# Patient Record
Sex: Female | Born: 1965
Health system: Southern US, Community
[De-identification: ages and names within clinical notes are randomized; demographics above are authoritative.]

## PROBLEM LIST (undated history)

## (undated) DIAGNOSIS — E785 Hyperlipidemia, unspecified: Secondary | ICD-10-CM

## (undated) DIAGNOSIS — E119 Type 2 diabetes mellitus without complications: Secondary | ICD-10-CM

## (undated) DIAGNOSIS — I10 Essential (primary) hypertension: Secondary | ICD-10-CM

## (undated) DIAGNOSIS — G709 Myoneural disorder, unspecified: Secondary | ICD-10-CM

## (undated) DIAGNOSIS — G629 Polyneuropathy, unspecified: Secondary | ICD-10-CM

## (undated) HISTORY — DX: Myoneural disorder, unspecified: G70.9

## (undated) HISTORY — DX: Hyperlipidemia, unspecified: E78.5

## (undated) HISTORY — DX: Essential (primary) hypertension: I10

---

## 2001-06-28 HISTORY — PX: TUBAL LIGATION: SHX77

## 2014-02-03 ENCOUNTER — Encounter (HOSPITAL_COMMUNITY): Payer: Self-pay | Admitting: *Deleted

## 2014-02-03 ENCOUNTER — Inpatient Hospital Stay (HOSPITAL_COMMUNITY)
Admission: AD | Admit: 2014-02-03 | Discharge: 2014-02-03 | Disposition: A | Discharge status: Home / Self Care | Payer: No Typology Code available for payment source | Source: Ambulatory Visit | Attending: Obstetrics & Gynecology | Admitting: Obstetrics & Gynecology

## 2014-02-03 DIAGNOSIS — N907 Vulvar cyst: Secondary | ICD-10-CM

## 2014-02-03 DIAGNOSIS — N9089 Other specified noninflammatory disorders of vulva and perineum: Secondary | ICD-10-CM | POA: Insufficient documentation

## 2014-02-03 HISTORY — DX: Type 2 diabetes mellitus without complications: E11.9

## 2014-02-03 NOTE — MAU Provider Note (Signed)
Chief Complaint: Cyst   First Provider Initiated Contact with Patient 02/03/14 2030     SUBJECTIVE HPI: Tammy Chan is a 48 y.o. G3P3 female who presents with a tender lump in her left labia majora x 2 days. Tried squeezing it to see if it would drain, but nothing came out. No Hx of similar masses. [place] in [place]. (moved here last October.)  Denies fever, chills, drainage, bleeding or redness. Has not tried anything for the pain.    Past Medical History  Diagnosis Date  . Diabetes mellitus without complication    OB History  Gravida Para Term Preterm AB SAB TAB Ectopic Multiple Living  3 3        3     # Outcome Date GA Lbr Len/2nd Weight Sex Delivery Anes PTL Lv  3 PAR           2 PAR           1 PAR              Past Surgical History  Procedure Laterality Date  . Tubal ligation     History   Social History  . Marital Status: Single    Spouse Name: N/A    Number of Children: N/A  . Years of Education: N/A   Occupational History  . Not on file.   Social History Main Topics  . Smoking status: Never Smoker   . Smokeless tobacco: Never Used  . Alcohol Use: Yes     Comment: socical  . Drug Use: No  . Sexual Activity: Not on file   Other Topics Concern  . Not on file   Social History Narrative  . No narrative on file   No current facility-administered medications on file prior to encounter.   No current outpatient prescriptions on file prior to encounter.   Allergies  Allergen Reactions  . Erythromycin Nausea And Vomiting  . Lisinopril Itching    Pt reports itching in throat    ROS: Pertinent items in HPI.  OBJECTIVE Blood pressure 139/81, pulse 85, temperature 98.1 F (36.7 C), temperature source Oral, resp. rate 18, height 5\' 2"  (1.575 m), weight 80.287 kg (177 lb). GENERAL: Well-developed, well-nourished female in no acute distress.  HEENT: Normocephalic HEART: normal rate RESP: normal effort ABDOMEN: Soft,  non-tender EXTREMITIES: Nontender, no edema NEURO: Alert and oriented PELVIC EXAM: NEFG except for 3 cm non-fluctuant, tender mess w/ mild erythema in mid-upper left labia majora. No warmth, drainage or bleeding. physiologic discharge, no blood noted. BIMANUAL: Declined  LAB RESULTS No results found for this or any previous visit (from the past 24 hour(s)).  IMAGING No results found.  MAU COURSE Not a candidate for I&D   ASSESSMENT 1. Inclusion cyst of vulva    PLAN Discharge home in stable condition.  Warms soaks/compresses 5+ x per day. Do not squeeze Follow-up Information   Follow up with [facility] On 02/06/2014. (at 2:00)    Contact information:   [address] [place] [id number] [phone]       [facility]. (As needed in emergencies)    Contact information:   [address] [id number] [place] [id number] [phone]        Medication List    Notice   You have not been prescribed any medications.    Tylenol PRN (Declines stronger pain meds.   [place] Nyelah Emmerich, [place] 02/03/2014  8:32 PM

## 2014-02-03 NOTE — MAU Note (Signed)
Noticed a cyst on left side of groin area on Friday. Worked this weekend and it has gotten larger.  Painful. No medications taken

## 2014-02-03 NOTE — Discharge Instructions (Signed)
Epidermal Cyst °An epidermal cyst is sometimes called a sebaceous cyst, epidermal inclusion cyst, or infundibular cyst. These cysts usually contain a substance that looks "pasty" or "cheesy" and may have a bad smell. This substance is a protein called keratin. Epidermal cysts are usually found on the face, neck, or trunk. They may also occur in the vaginal area or other parts of the genitalia of both men and women. Epidermal cysts are usually small, painless, slow-growing bumps or lumps that move freely under the skin. It is important not to try to pop them. This may cause an infection and lead to tenderness and swelling. °CAUSES  °Epidermal cysts may be caused by a deep penetrating injury to the skin or a plugged hair follicle, often associated with acne. °SYMPTOMS  °Epidermal cysts can become inflamed and cause: °· Redness. °· Tenderness. °· Increased temperature of the skin over the bumps or lumps. °· Grayish-white, bad smelling material that drains from the bump or lump. °DIAGNOSIS  °Epidermal cysts are easily diagnosed by your caregiver during an exam. Rarely, a tissue sample (biopsy) may be taken to rule out other conditions that may resemble epidermal cysts. °TREATMENT  °· Epidermal cysts often get better and disappear on their own. They are rarely ever cancerous. °· If a cyst becomes infected, it may become inflamed and tender. This may require opening and draining the cyst. Treatment with antibiotics may be necessary. When the infection is gone, the cyst may be removed with minor surgery. °· Small, inflamed cysts can often be treated with antibiotics or by injecting steroid medicines. °· Sometimes, epidermal cysts become large and bothersome. If this happens, surgical removal in your caregiver's office may be necessary. °HOME CARE INSTRUCTIONS °· Only take over-the-counter or prescription medicines as directed by your caregiver. °· Take your antibiotics as directed. Finish them even if you start to feel  better. °SEEK MEDICAL CARE IF:  °· Your cyst becomes tender, red, or swollen. °· Your condition is not improving or is getting worse. °· You have any other questions or concerns. °MAKE SURE YOU: °· Understand these instructions. °· Will watch your condition. °· Will get help right away if you are not doing well or get worse. °Document Released: 05/15/2004 Document Revised: 09/06/2011 Document Reviewed: 12/21/2010 °ExitCare® Patient Information ©2015 [employer]. This information is not intended to replace advice given to you by your health care provider. Make sure you discuss any questions you have with your health care provider. °Incision and Drainage °Incision and drainage is a procedure in which a sac-like structure (cystic structure) is opened and drained. The area to be drained usually contains material such as pus, fluid, or blood.  °LET YOUR CAREGIVER KNOW ABOUT:  °· Allergies to medicine. °· Medicines taken, including vitamins, herbs, eyedrops, over-the-counter medicines, and creams. °· Use of steroids (by mouth or creams). °· Previous problems with anesthetics or numbing medicines. °· History of bleeding problems or blood clots. °· Previous surgery. °· Other health problems, including diabetes and kidney problems. °· Possibility of pregnancy, if this applies. °RISKS AND COMPLICATIONS °· Pain. °· Bleeding. °· Scarring. °· Infection. °BEFORE THE PROCEDURE  °You may need to have an ultrasound or other imaging tests to see how large or deep your cystic structure is. Blood tests may also be used to determine if you have an infection or how severe the infection is. You may need to have a tetanus shot. °PROCEDURE  °The affected area is cleaned with a cleaning fluid. The cyst area will   will then be numbed with a medicine (local anesthetic). A small incision will be made in the cystic structure. A syringe or catheter may be used to drain the contents of the cystic structure, or the contents may be squeezed out. The  area will then be flushed with a cleansing solution. After cleansing the area, it is often gently packed with a gauze or another wound dressing. Once it is packed, it will be covered with gauze and tape or some other type of wound dressing. AFTER THE PROCEDURE   Often, you will be allowed to go home right after the procedure.  You may be given antibiotic medicine to prevent or heal an infection.  If the area was packed with gauze or some other wound dressing, you will likely need to come back in 1 to 2 days to get it removed.  The area should heal in about 14 days. Document Released: 12/08/2000 Document Revised: 12/14/2011 Document Reviewed: 08/09/2011 [facility] Patient Information 2015 [place] [place]. This information is not intended to replace advice given to you by your health care provider. Make sure you discuss any questions you have with your health care provider.

## 2014-02-04 NOTE — MAU Provider Note (Signed)
Attestation of Attending Supervision of Advanced Practitioner (CNM/NP): Evaluation and management procedures were performed by the Advanced Practitioner under my supervision and collaboration. I have reviewed the Advanced Practitioner's note and chart, and I agree with the management and plan.  Tammy Kasa H. 6:37 AM

## 2014-02-06 ENCOUNTER — Encounter: Payer: Self-pay | Admitting: Obstetrics and Gynecology

## 2014-02-06 ENCOUNTER — Ambulatory Visit (INDEPENDENT_AMBULATORY_CARE_PROVIDER_SITE_OTHER): Payer: No Typology Code available for payment source | Admitting: Obstetrics and Gynecology

## 2014-02-06 VITALS — BP 128/80 | HR 74 | Temp 98.3°F | Ht 63.0 in | Wt 175.2 lb

## 2014-02-06 DIAGNOSIS — N764 Abscess of vulva: Secondary | ICD-10-CM

## 2014-02-06 MED ORDER — SULFAMETHOXAZOLE-TMP DS 800-160 MG PO TABS: 1.0000 | ORAL_TABLET | Freq: Two times a day (BID) | ORAL | Status: DC

## 2014-02-06 NOTE — Progress Notes (Signed)
Patient ID: [name], female   DOB: 10/10/1965, 48 y.o.   MRN: [id number] 48 yo G3P3 presenting today as an MAU follow up for evaluation of a left labial abscess. Patient reports onset of painful lump on 8/7 which has increased in size and is painful with movement. Patient was seen on 8/9 and diagnosed with a labial abscess which was not ready to be I&D. Patient states that she has been applying heat pads to area and soaking the area daily without improvement in her symptoms  GENERAL: Well-developed, well-nourished female in no acute distress.  ABDOMEN: Soft, nontender, nondistended. No organomegaly. PELVIC: Normal external female genitalia. With 3 cm left labia indurated mass on superior aspect of labia. Central pimple like lesions. No area of fluctuance. Tender to touch EXTREMITIES: No cyanosis, clubbing, or edema, 2+ distal pulses.  A/P 48 yo with left labial abscess - Recommend heat pads for 20 minutes every hour - Sitz baths - Rx Bactrim provided - RTC on 8/17 for follow up and possible I&D

## 2014-02-11 ENCOUNTER — Telehealth: Payer: Self-pay | Admitting: *Deleted

## 2014-02-11 ENCOUNTER — Ambulatory Visit (INDEPENDENT_AMBULATORY_CARE_PROVIDER_SITE_OTHER): Payer: No Typology Code available for payment source | Admitting: Obstetrics and Gynecology

## 2014-02-11 ENCOUNTER — Encounter: Payer: Self-pay | Admitting: Obstetrics and Gynecology

## 2014-02-11 VITALS — BP 126/76 | HR 87 | Wt 175.8 lb

## 2014-02-11 DIAGNOSIS — N764 Abscess of vulva: Secondary | ICD-10-CM

## 2014-02-11 NOTE — Telephone Encounter (Signed)
Contacted patient, requested she come to her scheduled appointment early due to the provider needing to be out of the office. Pt verbalizes understanding.

## 2014-02-11 NOTE — Progress Notes (Signed)
Patient ID: [name], female   DOB: 01/20/1966, 48 y.o.   MRN: [id number] 48 yo G3P3 with left labial abscess presenting today for follow up. Patient reports abscess started to drain on Saturday and she reports significant improvement in her pain. A smaller induration noted on the mons pubis.  GENERAL: Well-developed, well-nourished female in no acute distress.  ABDOMEN: Soft, nontender, nondistended. No organomegaly. PELVIC: Normal external female genitalia. Left labia with 2 cm area of induration, small amount of purulent discharge expressed. 1 cm area of induration on mons, no fluctuance EXTREMITIES: No cyanosis, clubbing, or edema, 2+ distal pulses.  A/P 48 yo with left labia abscess - After informed consent was obtained, the abscess was I&D. 1cc of 1%lidocaine was injected. Using an 11 blade a 0.5 cm incision was made. The tip of a sterile q-tip was inserted to break up any loculation. Small amount of purulent material was expressed - Patient advised to continue applying warm compresses to the area along with the new area of induration - Complete antibiotics - Follow up with PCP for management of poorly controlled diabetes

## 2014-04-30 ENCOUNTER — Encounter: Payer: Self-pay | Admitting: Obstetrics and Gynecology

## 2014-05-06 ENCOUNTER — Encounter: Payer: Self-pay | Admitting: Obstetrics & Gynecology

## 2014-05-06 ENCOUNTER — Ambulatory Visit (INDEPENDENT_AMBULATORY_CARE_PROVIDER_SITE_OTHER): Payer: No Typology Code available for payment source | Admitting: Obstetrics & Gynecology

## 2014-05-06 VITALS — BP 138/85 | HR 93 | Temp 98.1°F | Ht 61.25 in | Wt 168.3 lb

## 2014-05-06 DIAGNOSIS — Z01419 Encounter for gynecological examination (general) (routine) without abnormal findings: Secondary | ICD-10-CM

## 2014-05-06 DIAGNOSIS — Z124 Encounter for screening for malignant neoplasm of cervix: Secondary | ICD-10-CM

## 2014-05-06 DIAGNOSIS — Z113 Encounter for screening for infections with a predominantly sexual mode of transmission: Secondary | ICD-10-CM

## 2014-05-06 DIAGNOSIS — N9089 Other specified noninflammatory disorders of vulva and perineum: Secondary | ICD-10-CM

## 2014-05-06 DIAGNOSIS — L900 Lichen sclerosus et atrophicus: Secondary | ICD-10-CM

## 2014-05-06 DIAGNOSIS — Z1151 Encounter for screening for human papillomavirus (HPV): Secondary | ICD-10-CM

## 2014-05-06 MED ORDER — CLOBETASOL PROPIONATE 0.05 % EX OINT: 1.0000 "application " | TOPICAL_OINTMENT | CUTANEOUS | Status: DC

## 2014-05-06 MED ORDER — LIDOCAINE HCL 2 % EX GEL: 1.0000 "application " | CUTANEOUS | Status: DC | PRN

## 2014-05-06 NOTE — Addendum Note (Signed)
Addended by: Jaynie CollinsANYANWU, Jalil Lorusso A on: 05/06/2014 04:43 PM   Modules accepted: Orders

## 2014-05-06 NOTE — Patient Instructions (Signed)
Return to clinic for any scheduled appointments or for any gynecologic concerns as needed.   

## 2014-05-06 NOTE — Progress Notes (Signed)
    GYNECOLOGY CLINIC ANNUAL PREVENTATIVE CARE ENCOUNTER NOTE  Subjective:     Tammy Chan is a 48 y.o. G3P3 female here for a routine annual gynecologic exam.  Current complaints: she reports a history of lichen sclerosus and has a flare up recently leading to a lot of scratching in her vulva. Wants refill of her Temovate and wants to make sure the area is not infected.  Denies any recent sexual activity, no history of STIs.  Still has regular menstrual periods. No other GYN concerns.  Gynecologic History Patient's last menstrual period was 02/27/2014. Contraception: none Last Pap: 2014. Results were: normal Never had a mammogram  Obstetric History OB History  Gravida Para Term Preterm AB SAB TAB Ectopic Multiple Living  3 3        3     # Outcome Date GA Lbr Len/2nd Weight Sex Delivery Anes PTL Lv  3 Para      Vag-Spont     2 Para      Vag-Spont     1 Para      Vag-Spont        The following portions of the patient's history were reviewed and updated as appropriate: allergies, current medications, past family history, past medical history, past social history, past surgical history and problem list.  Review of Systems Pertinent items are noted in HPI.    Objective:   BP 138/85 mmHg  Pulse 93  Temp(Src) 98.1 F (36.7 C)  Ht 5' 1.25" (1.556 m)  Wt 168 lb 4.8 oz (76.34 kg)  BMI 31.53 kg/m2  LMP 02/27/2014 GENERAL: Well-developed, well-nourished female in no acute distress.  HEENT: Normocephalic, atraumatic. Sclerae anicteric.  NECK: Supple. Normal thyroid.  LUNGS: Clear to auscultation bilaterally.  HEART: Regular rate and rhythm. BREASTS: Symmetric in size. No masses, skin changes, nipple drainage, or lymphadenopathy. ABDOMEN: Soft, nontender, nondistended. No organomegaly. PELVIC: Edematous and diffusely inflammed external female genitalia, some atrophy noted.  5 mm open sore/excoriation noted at confluence of labia minora; also smaller sores noted on clitoral hood  (1) and on the left labium minus (2).  No focal erythema noted, no fluid, no blisters. Tender to touch with cotton swab, HSV culture obtained. Vagina is pink and rugated.  Normal discharge. Normal cervix contour. Pap smear obtained. Uterus is normal in size. No adnexal mass or tenderness.  EXTREMITIES: No cyanosis, clubbing, or edema, 2+ distal pulses.   Assessment:   Annual gynecologic examination Lichen sclerosus Vulvar lesions   Plan:  Vulvar lesions could be due to excoriation; but concerned about other possible etiologies. HSV culture obtained; also obtained STI screen. Refilled Temovate and also gave lidocaine jelly for now, will follow up results and manage accordingly. Pap done, will follow up results and manage accordingly. Mammogram scheduled Routine preventative health maintenance measures emphasized   Tammy Chan  [name], MD, FACOG Attending Obstetrician & Gynecologist Center [facility], [facility]

## 2014-05-07 LAB — HIV ANTIBODY (ROUTINE TESTING W REFLEX): HIV 1&2 Ab, 4th Generation: NONREACTIVE

## 2014-05-07 LAB — HEPATITIS C ANTIBODY: HCV Ab: NEGATIVE

## 2014-05-07 LAB — CYTOLOGY - PAP

## 2014-05-07 LAB — HEPATITIS B SURFACE ANTIGEN: Hepatitis B Surface Ag: NEGATIVE

## 2014-05-07 LAB — RPR

## 2014-05-09 LAB — HERPES SIMPLEX VIRUS CULTURE: Organism ID, Bacteria: NOT DETECTED

## 2014-05-20 ENCOUNTER — Ambulatory Visit (HOSPITAL_COMMUNITY): Admission: RE | Admit: 2014-05-20 | Payer: No Typology Code available for payment source | Source: Ambulatory Visit

## 2015-07-13 ENCOUNTER — Encounter (HOSPITAL_COMMUNITY): Payer: Self-pay | Admitting: *Deleted

## 2015-07-13 ENCOUNTER — Inpatient Hospital Stay (HOSPITAL_COMMUNITY)
Admission: AD | Admit: 2015-07-13 | Discharge: 2015-07-13 | Disposition: A | Discharge status: Home / Self Care | Payer: BLUE CROSS/BLUE SHIELD | Source: Ambulatory Visit | Attending: Obstetrics & Gynecology | Admitting: Obstetrics & Gynecology

## 2015-07-13 DIAGNOSIS — Z794 Long term (current) use of insulin: Secondary | ICD-10-CM | POA: Diagnosis not present

## 2015-07-13 DIAGNOSIS — E119 Type 2 diabetes mellitus without complications: Secondary | ICD-10-CM | POA: Insufficient documentation

## 2015-07-13 DIAGNOSIS — N751 Abscess of Bartholin's gland: Secondary | ICD-10-CM

## 2015-07-13 DIAGNOSIS — N750 Cyst of Bartholin's gland: Secondary | ICD-10-CM | POA: Insufficient documentation

## 2015-07-13 DIAGNOSIS — Z3202 Encounter for pregnancy test, result negative: Secondary | ICD-10-CM | POA: Diagnosis not present

## 2015-07-13 DIAGNOSIS — Z7984 Long term (current) use of oral hypoglycemic drugs: Secondary | ICD-10-CM | POA: Insufficient documentation

## 2015-07-13 DIAGNOSIS — R102 Pelvic and perineal pain: Secondary | ICD-10-CM | POA: Diagnosis present

## 2015-07-13 LAB — URINALYSIS, ROUTINE W REFLEX MICROSCOPIC
Bilirubin Urine: NEGATIVE
Glucose, UA: >1000 mg/dL — AB
Hgb urine dipstick: NEGATIVE
Ketones, ur: NEGATIVE mg/dL
Leukocytes, UA: NEGATIVE
Nitrite: NEGATIVE
Protein, ur: NEGATIVE mg/dL
Specific Gravity, Urine: <1.005 — ABNORMAL LOW (ref 1.005–1.030)
pH: 6.5 (ref 5.0–8.0)

## 2015-07-13 LAB — POCT PREGNANCY, URINE: Preg Test, Ur: NEGATIVE

## 2015-07-13 LAB — URINE MICROSCOPIC-ADD ON
Bacteria, UA: NONE SEEN
RBC / HPF: NONE SEEN RBC/hpf (ref 0–5)
WBC, UA: NONE SEEN WBC/hpf (ref 0–5)

## 2015-07-13 LAB — GLUCOSE, CAPILLARY: Glucose-Capillary: 402 mg/dL — ABNORMAL HIGH (ref 65–99)

## 2015-07-13 MED ORDER — HYDROMORPHONE HCL 1 MG/ML IJ SOLN
1.0000 mg | Freq: Once | INTRAMUSCULAR | Status: AC
  Administered 2015-07-13: 1 mg via INTRAMUSCULAR
  Filled 2015-07-13: qty 1

## 2015-07-13 MED ORDER — DOXYCYCLINE HYCLATE 100 MG PO TABS: 100.0000 mg | ORAL_TABLET | Freq: Two times a day (BID) | ORAL | Status: DC

## 2015-07-13 MED ORDER — LIDOCAINE HCL 2 % EX GEL
1.0000 "application " | Freq: Once | CUTANEOUS | Status: AC
  Administered 2015-07-13: 1 via TOPICAL
  Filled 2015-07-13: qty 5

## 2015-07-13 NOTE — MAU Note (Signed)
Pt reports a cyst in her vaginal area that is draining and painful.denies fever.

## 2015-07-13 NOTE — MAU Provider Note (Signed)
History     CSN: [id number]  Arrival date and time: 07/13/15 1901   First Provider Initiated Contact with Patient 07/13/15 2101         Chief Complaint  Patient presents with  . Groin Swelling  . Vaginal Pain   HPI Tammy Chan is a 50 y.o. female who presents for painful vaginal lump.  Patient states lump has been there for some time (can't tell how long), but has become painful & grown in size over the last several days. Has had some bloody discharge since last night. No treatment. Has had abscesses there before the were lanced.  Denies any other symptoms.   OB History    Gravida Para Term Preterm AB TAB SAB Ectopic Multiple Living   3 3        3       Past Medical History  Diagnosis Date  . Diabetes mellitus without complication [facility])     Past Surgical History  Procedure Laterality Date  . Tubal ligation      Family History  Problem Relation Age of Onset  . Hypertension Mother   . Hypertension Father   . Hypertension Sister     Social History  Substance Use Topics  . Smoking status: Never Smoker   . Smokeless tobacco: Never Used  . Alcohol Use: No     Comment: socical    Allergies:  Allergies  Allergen Reactions  . Erythromycin Nausea And Vomiting  . Lisinopril Itching    Pt reports itching in throat    Prescriptions prior to admission  Medication Sig Dispense Refill Last Dose  . clobetasol ointment (TEMOVATE) 0.05 % Apply 1 application topically 3 (three) times a week. For three weeks, then apply once a week 30 g 4   . glucose blood (ACCU-CHEK COMFORT CURVE) test strip    Taking  . glucose blood test strip Test blood sugar 4 times daily, before meals and at bedtime.   Taking  . Insulin NPH Isophane & Regular (HUMULIN 70/30 [place]) Inject 20-80 Units into the skin 2 (two) times daily. 80 units BID;  20 units with meals   Taking  . Lancets (FREESTYLE) lancets Test blood sugar four times daily, before meals and at bedtime.   Taking  . lidocaine  (XYLOCAINE) 2 % jelly Apply 1 application topically as needed. 30 mL 2   . losartan (COZAAR) 50 MG tablet Take 50 mg by mouth daily.   Taking  . metFORMIN (GLUCOPHAGE) 1000 MG tablet Take 1,000 mg by mouth 2 (two) times daily with a meal.   Taking  . simvastatin (ZOCOR) 40 MG tablet Take 40 mg by mouth at bedtime.   Taking    Review of Systems  Constitutional: Negative.   Gastrointestinal: Negative.   Genitourinary: Negative.   Skin:       + pain bump on left labia   Physical Exam   Blood pressure 129/91, pulse 96, temperature 98 F (36.7 C), temperature source Oral, resp. rate 18, height 5' 2.75" (1.594 m), weight 161 lb (73.029 kg), SpO2 97 %.  Physical Exam  Nursing note and vitals reviewed. Constitutional: She is oriented to person, place, and time. She appears well-developed and well-nourished. No distress.  HENT:  Head: Normocephalic and atraumatic.  Eyes: Conjunctivae are normal. Right eye exhibits no discharge. Left eye exhibits no discharge. No scleral icterus.  Neck: Normal range of motion.  Cardiovascular: Normal rate, regular rhythm and normal heart sounds.   No murmur heard. Respiratory: Effort  normal and breath sounds normal. No respiratory distress. She has no wheezes.  Genitourinary:     Neurological: She is alert and oriented to person, place, and time.  Skin: Skin is warm and dry. She is not diaphoretic.  Psychiatric: She has a normal mood and affect. Her behavior is normal. Judgment and thought content normal.    MAU Course  Procedures Results for orders placed or performed during the hospital encounter of 07/13/15 (from the past 24 hour(s))  Urinalysis, Routine w reflex microscopic (not at [facility])     Status: Abnormal   Collection Time: 07/13/15  7:15 PM  Result Value Ref Range   Color, Urine YELLOW YELLOW   APPearance CLEAR CLEAR   Specific Gravity, Urine <1.005 (L) 1.005 - 1.030   pH 6.5 5.0 - 8.0   Glucose, UA >1000 (A) NEGATIVE mg/dL   Hgb urine  dipstick NEGATIVE NEGATIVE   Bilirubin Urine NEGATIVE NEGATIVE   Ketones, ur NEGATIVE NEGATIVE mg/dL   Protein, ur NEGATIVE NEGATIVE mg/dL   Nitrite NEGATIVE NEGATIVE   Leukocytes, UA NEGATIVE NEGATIVE  Urine microscopic-add on     Status: Abnormal   Collection Time: 07/13/15  7:15 PM  Result Value Ref Range   Squamous Epithelial / LPF 0-5 (A) NONE SEEN   WBC, UA NONE SEEN 0 - 5 WBC/hpf   RBC / HPF NONE SEEN 0 - 5 RBC/hpf   Bacteria, UA NONE SEEN NONE SEEN  Pregnancy, urine POC     Status: None   Collection Time: 07/13/15  7:42 PM  Result Value Ref Range   Preg Test, Ur NEGATIVE NEGATIVE  Glucose, capillary     Status: Abnormal   Collection Time: 07/13/15  8:34 PM  Result Value Ref Range   Glucose-Capillary 402 (H) 65 - 99 mg/dL    MDM >1191>1000 glucose in urine, CBG 402. Pt is diabetic, followed by endocrinologist. Patient states her last A1C was 14 and she routinely has blood sugars this high. Denies any symptoms or complaints other than her abscess and doesn't want to discuss her blood sugars. Next appointment with her endocrinologist is in 2 weeks.   Patient premedicated with lidocaine gel & dilaudid IM  Bartholin Cyst I&D Enlarged abscess palpated in front of the hymenal ring around 7 o' clock.  Written informed consent was obtained.  Discussed complications and possible outcomes of procedure including recurrence of cyst, scarring leading to infecton, bleeding, dyspareunia, distortion of anatomy.  Patient was examined in the dorsal lithotomy position and mass was identified. The area was prepped with Iodine and draped in a sterile manner. 1% Lidocaine (3 ml) was then used to infiltrate area on top of the cyst, behind the hymenal ring.  A 7 mm incision was made using a sterile scapel. Upon palpation of the mass, a small amount of bloody purulent drainage was expressed through the incision. A hemostat was used to break up loculations, which resulted in expression of more bloody purulent  drainage.The open cyst was then copiously irrigated with normal saline. Patient tolerated the procedure well.  Assessment and Plan  A: 1. Bartholin's gland abscess    P: Discharge home Continue warm compresses and sitz baths Discussed reasons to return Keep f/u with endocrinologist Discussed likelihood of recurrence of abscesses d/t poor diabetic control Rx doxycyline  Judeth HornErin Ronte Parker, NP   07/13/2015, 8:38 PM

## 2015-07-13 NOTE — Discharge Instructions (Signed)
Bartholin Cyst or Abscess A Bartholin cyst is a fluid-filled sac that forms on a Bartholin gland. Bartholin glands are small glands that are located within the folds of skin (labia) along the sides of the lower opening of the vagina. These glands produce a fluid to moisten the outside of the vagina during sexual intercourse. A Bartholin cyst causes a bulge on the side of the vagina. A cyst that is not large or infected may not cause symptoms or problems. However, if the fluid within the cyst becomes infected, the cyst can turn into an abscess. An abscess may cause discomfort or pain. CAUSES A Bartholin cyst may develop when the duct of the gland becomes blocked. In many cases, the cause of this is not known. Various kinds of bacteria can cause the cyst to become infected and develop into an abscess. RISK FACTORS You may be at an increased risk of developing a Bartholin cyst or abscess if:  You are a woman of reproductive age.  You have a history of previous Bartholin cysts or abscesses.  You have diabetes.  You have a sexually transmitted disease (STD). SIGNS AND SYMPTOMS The severity of symptoms varies depending on the size of the cyst and whether it is infected. Symptoms may include:  A bulge or swelling near the lower opening of your vagina.  Discomfort or pain.  Redness.  Pain during sexual intercourse.  Pain when walking.  Fluid draining from the area. DIAGNOSIS Your health care provider may make a diagnosis based on your symptoms and a physical exam. He or she will look for swelling in your vaginal area. Blood tests may be done to check for infections. A sample of fluid from the cyst or abscess may also be taken to be tested in a lab. TREATMENT Small cysts that are not infected may not require any treatment. These often go away on their own. Yourhealth care provider will recommend hot baths and the use of warm compresses. These may also be part of the treatment for an abscess.  Treatment options for a large cyst or abscess may include:   Antibiotic medicine.  A surgical procedure to drain the abscess. One of the following procedures may be done:  Incision and drainage. An incision is made in the cyst or abscess so that the fluid drains out. A catheter may be placed inside the cyst so that it does not close and fill up with fluid again. The catheter will be removed after you have a follow-up visit with a specialist (gynecologist).  Marsupialization. The cyst or abscess is opened and kept open by stitching the edges of the skin to the walls of the cyst or abscess. This allows it to continue to drain and not fill up with fluid again. If you have cysts or abscesses that keep returning and have required incision and drainage multiple times, your health care provider may talk to you about surgery to remove the Bartholin gland. HOME CARE INSTRUCTIONS  Take medicines only as directed by your health care provider.  If you were prescribed an antibiotic medicine, finish it all even if you start to feel better.  Apply warm, wet compresses to the area or take warm, shallow baths that cover your pelvic region (sitz baths) several times a day or as directed by your health care provider.  Do not squeeze the cyst or apply heavy pressure to it.  Do not have sexual intercourse until the cyst has gone away.  If your cyst or abscess was   opened, a small piece of gauze or a drain may have been placed in the area to allow drainage. Do not remove the gauze or the drain until directed by your health care provider.  Wear feminine pads--not tampons--as needed for any drainage or bleeding.  Keep all follow-up visits as directed by your health care provider. This is important. PREVENTION Take these steps to help prevent a Bartholin cyst from returning:  Practice good hygiene.   Clean your vaginal area with mild soap and a soft cloth when you bathe.  Practice safe sex to prevent  STDs. SEEK MEDICAL CARE IF:  You have increased pain, swelling, or redness in the area of the cyst.  Puslike drainage is coming from the cyst.  You have a fever.   This information is not intended to replace advice given to you by your health care provider. Make sure you discuss any questions you have with your health care provider.   Document Released: 06/14/2005 Document Revised: 07/05/2014 Document Reviewed: 01/28/2014 Elsevier Interactive Patient Education 2016 [employer] Inc[employer]  

## 2015-12-18 DIAGNOSIS — L02811 Cutaneous abscess of head [any part, except face]: Secondary | ICD-10-CM | POA: Diagnosis not present

## 2015-12-25 DIAGNOSIS — L02811 Cutaneous abscess of head [any part, except face]: Secondary | ICD-10-CM | POA: Diagnosis not present

## 2016-03-23 DIAGNOSIS — L0102 Bockhart's impetigo: Secondary | ICD-10-CM | POA: Diagnosis not present

## 2016-07-16 DIAGNOSIS — E785 Hyperlipidemia, unspecified: Secondary | ICD-10-CM | POA: Diagnosis not present

## 2016-07-16 DIAGNOSIS — E119 Type 2 diabetes mellitus without complications: Secondary | ICD-10-CM | POA: Diagnosis not present

## 2016-07-16 DIAGNOSIS — I10 Essential (primary) hypertension: Secondary | ICD-10-CM | POA: Diagnosis not present

## 2016-07-16 LAB — MICROALBUMIN, URINE: Microalb, Ur: 18.1

## 2016-08-04 NOTE — Progress Notes (Signed)
Patient ID: Tammy Chan, female   DOB: 06/25/1966, 51 y.o.   MRN: [id number]          Reason for Appointment: Consultation for Type 2 Diabetes  Referring physician: Shirlean Mylararol Webb   History of Present Illness:          Date of diagnosis of type 2 diabetes mellitus: 1986       Background history:   She thinks she was started on insulin at time of diagnosis, this was at the age of about [age] She also had tried some oral medications which apparently did not work including metformin Over the years she has taken various insulin regimens including premixed insulin, Lantus and NovoLog Detailed records are not available and not clear what her level of control has been A1c in 2014 and 2015 at [facility] was around 14  Recent history:   INSULIN regimen is: Novolog mix 100 U pc twice a day  Non-insulin hypoglycemic drugs the patient is taking are: None  Current management, blood sugar patterns and problems identified:  She is usually taking her insulin consistently when she is not working and she is generally eating 3 meals a day on those days  However 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 Reino KentPepper at work to keep herself awake, usually drinking 32 ounces during her work hours  She does not follow any specific diet although tries to avoid fried food     Her weight has generally been about the same  Because of her high sugars she tends to get periodic vaginal candidiasis, recently prescribed Diflucan       Side effects from medications have been: None, has difficulty swallowing large tablets leg metformin  Compliance  with the medical regimen: Fair Hypoglycemia:   only if not eating and taking insulin  Glucose monitoring:  done 1-2 times a day         Glucometer: Relion     Blood Glucose readings by time of day and averages from meter download:  PREMEAL Breakfast Lunch Dinner Bedtime  Overall   Glucose range: 255-300  400    Median:        Self-care:  Meal times are:  Breakfast is 9 am Lunch: 11 pm Dinner: 6 pm  Typical meal intake: Breakfast is eggs, toast or cereal, Lunch = 30 sandwich, evening in the evening meal is  chicken and rice   Drinks regular soft drinks              Dietician visit, most recent: 2013                Exercise:  walking in the morning, 30 minutes, 3 times a week  Weight history:  Wt Readings from Last 3 Encounters:  08/05/16 161 lb 4 oz (73.1 kg)  07/13/15 161 lb (73 kg)  05/06/14 168 lb 4.8 oz (76.3 kg)    Glycemic control: A1c in 1/18 was 15.5   No results found for: HGBA1C No results found for: GLUF, MICROALBUR, LDLCALC, CREATININE No results found for: MICRALBCREAT     Allergies  as of 08/05/2016      Reactions   Erythromycin Nausea And Vomiting   Lisinopril Itching, Other (See Comments), Cough   Reaction:  Throat itching       Medication List       Accurate as of 08/05/16 12:50 PM. Always use your most recent med list.          clobetasol ointment 0.05 % Commonly known as:  TEMOVATE Apply 1 application topically daily as needed (for itching).   gabapentin 300 MG capsule Commonly known as:  NEURONTIN Take 1 capsule (300 mg total) by mouth 3 (three) times daily.   Insulin Pen Needle 31G X 5 MM Misc Use with insulin pen 3 times a day   insulin regular human CONCENTRATED 500 UNIT/ML kwikpen Commonly known as:  HUMULIN R U-500 KWIKPEN 75 Units, 30 minutes before each meal or as directed   losartan 50 MG tablet Commonly known as:  COZAAR Take 50 mg by mouth daily.   simvastatin 40 MG tablet Commonly known as:  ZOCOR Take 40 mg by mouth at  bedtime.       Allergies:  Allergies  Allergen Reactions  . Erythromycin Nausea And Vomiting  . Lisinopril Itching, Other (See Comments) and Cough    Reaction:  Throat itching     Past Medical History:  Diagnosis Date  . Diabetes mellitus without complication (HCC)   . Hyperlipidemia   . Hypertension   . Neuromuscular disorder [facility])     Past Surgical History:  Procedure Laterality Date  . TUBAL LIGATION  2003    Family History  Problem Relation Age of Onset  . Hypertension Mother   . Heart disease Mother   . Hypertension Father   . Hypertension Sister   . Hypertension Sister   . Diabetes Maternal Grandfather   . Diabetes Paternal Grandfather     Social History:  reports that she has never smoked. She has never used smokeless tobacco. She reports that she does not drink alcohol or use drugs.   Review of Systems  Constitutional: Negative for weight loss and weight gain.  HENT: Negative for nasal congestion.   Eyes: Negative for blurred vision.  Respiratory: Negative for shortness of breath.   Cardiovascular: Positive for palpitations. Negative for chest pain and leg swelling.       Has had 2 episodes in the last few weeks  Gastrointestinal: Negative for diarrhea and abdominal pain.  Endocrine: Positive for fatigue.       She has had a few menstrual cycles in the last year  Musculoskeletal: Negative for muscle aches and muscle cramps.  Skin: Positive for itching.       Some itching on the back with the rash. Sometimes has vaginal itching, recently better with using Diflucan  Neurological: Positive for numbness and tingling. Negative for tremors.       Burning and sometimes pains in her feet and lower legs especially at night, only slightly better with gabapentin Has had 2 episodes where she felt shaky  Psychiatric/Behavioral: Negative for insomnia.     Lipid history: On simvastatin for several years but taking this irregularly and last LDL was 117 at PCP  office   No results found for: CHOL, HDL, LDLCALC, LDLDIRECT, TRIG, CHOLHDL         Hypertension: On treatment for the last couple of years  Most recent eye exam was 2017  Most recent foot exam: 1/18    LABS:  No visits with results within 1  Week(s) from this visit.  Latest known visit with results is:  Admission on 07/13/2015, Discharged on 07/13/2015  Component Date Value Ref Range Status  . Color, Urine 07/13/2015 YELLOW  YELLOW Final  . APPearance 07/13/2015 CLEAR  CLEAR Final  . Specific Gravity, Urine 07/13/2015 <1.005* 1.005 - 1.030 Final  . pH 07/13/2015 6.5  5.0 - 8.0 Final  . Glucose, UA 07/13/2015 >1000* NEGATIVE mg/dL Final  . Hgb urine dipstick 07/13/2015 NEGATIVE  NEGATIVE Final  . Bilirubin Urine 07/13/2015 NEGATIVE  NEGATIVE Final  . Ketones, ur 07/13/2015 NEGATIVE  NEGATIVE mg/dL Final  . Protein, ur 16/03/9603 NEGATIVE  NEGATIVE mg/dL Final  . Nitrite 54/02/8118 NEGATIVE  NEGATIVE Final  . Leukocytes, UA 07/13/2015 NEGATIVE  NEGATIVE Final  . Preg Test, Ur 07/13/2015 NEGATIVE  NEGATIVE Final   Comment:        THE SENSITIVITY OF THIS METHODOLOGY IS >24 mIU/mL   . Squamous Epithelial / LPF 07/13/2015 0-5* NONE SEEN Final  . WBC, UA 07/13/2015 NONE SEEN  0 - 5 WBC/hpf Final  . RBC / HPF 07/13/2015 NONE SEEN  0 - 5 RBC/hpf Final  . Bacteria, UA 07/13/2015 NONE SEEN  NONE SEEN Final  . Glucose-Capillary 07/13/2015 402* 65 - 99 mg/dL Final    Physical Examination:  BP 136/84   Pulse 92   Temp 97.8 F (36.6 C) (Oral)   Resp 16   Ht 5' 2.25" (1.581 m)   Wt 161 lb 4 oz (73.1 kg)   SpO2 98%   BMI 29.26 kg/m   GENERAL:         Patient has Mild generalized obesity, also some abdominal obesity .   HEENT:         Eye exam shows normal external appearance. Fundus exam difficult to achieve, no gross retinopathy Oral exam shows normal mucosa .  NECK:   There is no lymphadenopathy  Thyroid is not enlarged and no nodules felt.  Carotids are normal to  palpation and no bruit heard LUNGS:         Chest is symmetrical. Lungs are clear to auscultation.Marland Kitchen   HEART:         Heart sounds:  S1 and S2 are normal. No murmur or click heard., no S3 or S4.   ABDOMEN:   There is no distention present. Liver and spleen are not palpable. No other mass or tenderness present.   NEUROLOGICAL:   Ankle jerks are absent bilaterally.  biceps reflexes are normal.  No tremor  Diabetic Foot Exam - Simple   Simple Foot Form Diabetic Foot exam was performed with the following findings:  Yes 08/05/2016 12:03 PM  Visual Inspection No deformities, no ulcerations, no other skin breakdown bilaterally:  Yes Sensation Testing Intact to touch and monofilament testing bilaterally:  Yes Pulse Check Posterior Tibialis and Dorsalis pulse intact bilaterally:  Yes Comments            Vibration sense is Mildly reduced in distal first toes. MUSCULOSKELETAL:  There is no swelling or deformity of the peripheral joints. Spine is normal to inspection.   EXTREMITIES:     There is no edema. No skin lesions present.Marland Kitchen SKIN:       No rash or lesions of concern.        ASSESSMENT:  Diabetes type 2, insulin resistant, uncontrolled     She appears to have had persistently poor control of her diabetes despite taking large doses of insulin Although she is on 100 units of  insulin twice a day she is not taking her morning dose when coming back from work Also unable to swallow metformin tablets which are large. She does not always watch her diet and is usually drinking regular soft drinks while at work for the caffeine  Since she is highly insulin resistant she may respond better to the concentrated insulin She does clearly need more diabetes education also  Complications of diabetes: Has symptoms of neuropathy, reportedly no retinopathy  HYPERTENSION: Fair control  Hyperlipidemia: Last LDL was above target because of her taking simvastatin irregularly   PLAN:    Change the NovoLog  mix insulin to the Humulin R U-500 insulin about 200 units a day for now  Discussed in detail how this insulin is different and the need to take to 30 minutes before meals  She will start with 60 units for breakfast and lunch and 75 at her main meal in the evening  She needs to take half the dose if skipping a meal since she needs this as her basal insulin also  She may respond to a GLP-1 drug especially to help postprandial hyperglycemia and promote some weight loss.  She was given the study samples of OZEMPIC and she will use 0.25 mg weekly for 2 injections and then 0.5 mg weekly  Discussed need to check her sugars at various times a day including after meals and bring her monitor for download  May also consider using the freestyle Libre sensor  She needs to stop drinking regular soft drinks and switch to diet  Consultations with dietitian and nurse educator  She needs to take her simvastatin daily with her blood pressure medicine  Follow-up in 3 weeks   Patient Instructions  BLOOD sugar monitoring: Check blood sugar before every meal and also at least every other day at BEDTIME Bring blood sugar monitor with you  DIET: Stop drinking regular Dr. Reino Kent, all meals need to be low fat  HUMULIN R-500 insulin: This must be taken about 30 minutes BEFORE your planned meals  Start with 60 units before breakfast and lunch and 75 units before evening meal  If not eating a meal may take only 30 units at that time including when coming back from work  OZEMPIC: 0.25 mg weekly for the first 2 weeks and then 0.5 mg weekly.   Most likely you will feel fullness in his stomach and may have mild nausea also     Counseling time on subjects discussed above is over 50% of today's 60 minute visit   Consultation note has been sent to the referring physician  [facility] 08/05/2016, 12:50 PM   Note: This office note was prepared with Dragon voice recognition system technology. Any  transcriptional errors that result from this process are unintentional.

## 2016-08-05 ENCOUNTER — Encounter: Payer: Self-pay | Admitting: Endocrinology

## 2016-08-05 ENCOUNTER — Ambulatory Visit (INDEPENDENT_AMBULATORY_CARE_PROVIDER_SITE_OTHER): Payer: BLUE CROSS/BLUE SHIELD | Admitting: Endocrinology

## 2016-08-05 VITALS — BP 136/84 | HR 92 | Temp 97.8°F | Resp 16 | Ht 62.25 in | Wt 161.2 lb

## 2016-08-05 DIAGNOSIS — E1165 Type 2 diabetes mellitus with hyperglycemia: Secondary | ICD-10-CM

## 2016-08-05 DIAGNOSIS — Z794 Long term (current) use of insulin: Secondary | ICD-10-CM | POA: Diagnosis not present

## 2016-08-05 MED ORDER — INSULIN REGULAR HUMAN (CONC) 500 UNIT/ML ~~LOC~~ SOPN: PEN_INJECTOR | SUBCUTANEOUS | 0 refills | Status: DC

## 2016-08-05 MED ORDER — GABAPENTIN 300 MG PO CAPS: 300.0000 mg | ORAL_CAPSULE | Freq: Three times a day (TID) | ORAL | 3 refills | Status: DC

## 2016-08-05 MED ORDER — INSULIN PEN NEEDLE 31G X 5 MM MISC: 2 refills | Status: DC

## 2016-08-05 NOTE — Patient Instructions (Signed)
BLOOD sugar monitoring: Check blood sugar before every meal and also at least every other day at BEDTIME Bring blood sugar monitor with you  DIET: Stop drinking regular Dr. Reino KentPepper, all meals need to be low fat  HUMULIN R-500 insulin: This must be taken about 30 minutes BEFORE your planned meals  Start with 60 units before breakfast and lunch and 75 units before evening meal  If not eating a meal may take only 30 units at that time including when coming back from work  OZEMPIC: 0.25 mg weekly for the first 2 weeks and then 0.5 mg weekly.   Most likely you will feel fullness in his stomach and may have mild nausea also

## 2016-08-12 ENCOUNTER — Telehealth: Payer: Self-pay | Admitting: Endocrinology

## 2016-08-12 NOTE — Telephone Encounter (Signed)
Humulin r 500 needs a PA # [phone]

## 2016-08-12 NOTE — Telephone Encounter (Signed)
PA for insulin regular human CONCENTRATED (HUMULIN R U-500 KWIKPEN) 500 UNIT/ML kwikpen  [id number] [place], [place] - [address] [phone] (Phone) [phone] (Fax)

## 2016-08-17 ENCOUNTER — Other Ambulatory Visit: Payer: Self-pay

## 2016-08-17 ENCOUNTER — Telehealth: Payer: Self-pay | Admitting: Endocrinology

## 2016-08-17 NOTE — Telephone Encounter (Signed)
Patient ask you to give her a call concerning her medication [phone]

## 2016-08-17 NOTE — Telephone Encounter (Signed)
Need to do PA 

## 2016-08-19 NOTE — Telephone Encounter (Signed)
duplicate

## 2016-08-25 ENCOUNTER — Encounter: Payer: BLUE CROSS/BLUE SHIELD | Attending: Endocrinology | Admitting: Nutrition

## 2016-08-25 ENCOUNTER — Other Ambulatory Visit: Payer: Self-pay

## 2016-08-25 ENCOUNTER — Telehealth: Payer: Self-pay | Admitting: Endocrinology

## 2016-08-25 DIAGNOSIS — L299 Pruritus, unspecified: Secondary | ICD-10-CM | POA: Diagnosis not present

## 2016-08-25 DIAGNOSIS — Z794 Long term (current) use of insulin: Secondary | ICD-10-CM | POA: Insufficient documentation

## 2016-08-25 DIAGNOSIS — Z713 Dietary counseling and surveillance: Secondary | ICD-10-CM | POA: Insufficient documentation

## 2016-08-25 DIAGNOSIS — E1165 Type 2 diabetes mellitus with hyperglycemia: Secondary | ICD-10-CM | POA: Insufficient documentation

## 2016-08-25 DIAGNOSIS — M542 Cervicalgia: Secondary | ICD-10-CM | POA: Diagnosis not present

## 2016-08-25 LAB — BASIC METABOLIC PANEL: Creatinine: 0.6 mg/dL (ref <=1.1)

## 2016-08-25 MED ORDER — INSULIN ASPART PROT & ASPART (70-30 MIX) 100 UNIT/ML ~~LOC~~ SUSP: 100.0000 [IU] | Freq: Two times a day (BID) | SUBCUTANEOUS | 0 refills | Status: DC

## 2016-08-25 NOTE — Progress Notes (Signed)
Pt. Had not started the U-500 insulin because it needed a prior authorization that had not been completed.  She went to her pharmacy several times, but they told her that we needed to do some paperwork before it could be ordered. With Megan's help, we went on line to get the prior auth.  The patient was notified of this. She is currently taking Novolog Mix 100u BID, and says that she run out on Friday.  Note to Dr. Lucianne MussKumar to order more Novolog for her.   She did not bring her meter, but says that they are reading between 150-160 before breakfast and supper.  She has had one low blood sugar, but treated it appropriately. She was shown the u-500 pen and how to dial the dose.  She reported good understanding of this.   We reviewed the timing of the R insulin and the need to wait 30 min. After injecting before eating.  She reported good understanding of this and had no final questions.

## 2016-08-25 NOTE — Telephone Encounter (Signed)
What is the status of the insulin PA

## 2016-08-26 ENCOUNTER — Other Ambulatory Visit: Payer: Self-pay

## 2016-08-26 NOTE — Telephone Encounter (Signed)
PA was resubmitted on 08/25/2016. Waiting on response rx for novolog 70/30 has been submitted till we get the approval back on the Humulin U-500. 

## 2016-08-26 NOTE — Telephone Encounter (Signed)
PA was resubmitted on 08/25/2016. Waiting on response rx for novolog 70/30 has been submitted till we get the approval back on the Humulin U-500.

## 2016-08-27 ENCOUNTER — Encounter: Payer: Self-pay | Admitting: Endocrinology

## 2016-08-27 ENCOUNTER — Telehealth: Payer: Self-pay | Admitting: Endocrinology

## 2016-08-27 ENCOUNTER — Ambulatory Visit: Payer: BLUE CROSS/BLUE SHIELD | Admitting: Endocrinology

## 2016-08-27 ENCOUNTER — Encounter: Payer: BLUE CROSS/BLUE SHIELD | Admitting: Dietician

## 2016-08-27 NOTE — Telephone Encounter (Signed)
-----   Message from Reather LittlerAjay Kumar, MD sent at 08/27/2016  2:59 PM EST ----- Regarding: Appt Pl schedule f/u  ----- Message ----- From: Jessica PriestLinda D Spagnola, RN Sent: 08/25/2016   3:59 PM To: Reather LittlerAjay Kumar, MD  Since she has not started the u-500, do you still need to see her on Friday of this week? She says blood sugars are in the 140s-150 on Novolog 70/30 100u bid.  Did not bring her meter.  Please let front desk know if you do not want to see her.

## 2016-08-27 NOTE — Telephone Encounter (Signed)
Called pt to get scheduled, no answer no machine  Mailed letter

## 2016-09-08 DIAGNOSIS — Z01818 Encounter for other preprocedural examination: Secondary | ICD-10-CM | POA: Diagnosis not present

## 2016-09-27 ENCOUNTER — Encounter: Payer: Self-pay | Admitting: Endocrinology

## 2016-09-27 ENCOUNTER — Ambulatory Visit: Payer: BLUE CROSS/BLUE SHIELD | Admitting: Endocrinology

## 2016-09-27 ENCOUNTER — Ambulatory Visit (INDEPENDENT_AMBULATORY_CARE_PROVIDER_SITE_OTHER): Payer: BLUE CROSS/BLUE SHIELD | Admitting: Endocrinology

## 2016-09-27 VITALS — BP 122/82 | HR 93 | Ht 62.5 in | Wt 165.0 lb

## 2016-09-27 DIAGNOSIS — E1165 Type 2 diabetes mellitus with hyperglycemia: Secondary | ICD-10-CM | POA: Diagnosis not present

## 2016-09-27 DIAGNOSIS — E782 Mixed hyperlipidemia: Secondary | ICD-10-CM

## 2016-09-27 DIAGNOSIS — Z794 Long term (current) use of insulin: Secondary | ICD-10-CM

## 2016-09-27 LAB — LIPID PANEL
Cholesterol: 149 mg/dL (ref 0–200)
HDL: 56.5 mg/dL (ref >39.00)
LDL Cholesterol: 81 mg/dL (ref 0–99)
NonHDL: 92.95
Total CHOL/HDL Ratio: 3
Triglycerides: 60 mg/dL (ref 0.0–149.0)
VLDL: 12 mg/dL (ref 0.0–40.0)

## 2016-09-27 NOTE — Patient Instructions (Addendum)
45 at lunch  Work days take 40 units in am  Check blood sugars on waking up  4x weekly  Also check blood sugars about 2 hours after a meal and do this after different meals by rotation  Recommended blood sugar levels on waking up is 90-130 and about 2 hours after meal is 130-160  Please bring your blood sugar monitor to each visit, thank you

## 2016-09-27 NOTE — Progress Notes (Signed)
Patient ID: Tammy Chan, female   DOB: 12-Apr-1966, 51 y.o.   MRN: [id number]          Reason for Appointment:  follow-up  for Type 2 Diabetes  Referring physician: Shirlean Mylar   History of Present Illness:          Date of diagnosis of type 2 diabetes mellitus: 1986       Background history:   She thinks she was started on insulin at time of diagnosis, this was at the age of about 89 She also had tried some oral medications which apparently did not work including metformin Over the years she has taken various insulin regimens including premixed insulin, Lantus and NovoLog Detailed records are not available and not clear what her level of control has been A1c in 2014 and 2015 at [facility] was around 14  Recent history:   INSULIN regimen is: Humulin R U-500 insulin 50 U ac 3x a day  Non-insulin hypoglycemic drugs the patient is taking are: None  Current management, blood sugar patterns and problems identified:  She had marked increase in blood sugars prior to her visit even with taking at least 200 units of premixed insulin  After much paperwork with insurance company we were able to get the Humulin R prior authorized  She started this 3-4 weeks ago  However even though she was told to take 75 units she was afraid to take a large dose and started taking only 30 units  More recently she has been taking 50 units before each meal at least since 08/30/13 when her sugar was 257  Her blood sugars are dramatically better with the new insulin and more recently averaging about 140  She is checking blood sugars however are mostly FASTING  These readings are relatively variable but fairly good recently without overnight hypoglycemia  She says that when she is trying to work long hours feel jittery, dizzy and has difficulty focusing in the early afternoon and relieved by food.  She was also told to cut out a lot of regular soft drinks which she is trying to do better now, she thinks  she can do better since she is not working night shifts and does not need to caffeine  No further problems with vaginal candidiasis  She was also given a sample of Ozempic to try but she had diarrhea from this and did not continue       Side effects from medications have been: Diarrhea from Ozempic, has difficulty swallowing large tablets like metformin  Compliance with the medical regimen: Fair Hypoglycemia:   only if not eating and taking insulin  Glucose monitoring:  done 1-2 times a day         Glucometer:  Contour  Mean values apply above for all meters except median for One Touch  PRE-MEAL Fasting Lunch Dinner Bedtime Overall  Glucose range: 95-157  100-195   129, 139    Mean/median: 1 44  146   134  140    Self-care:  Meal times are:  Breakfast is 9 am Lunch: 11 pm Dinner: 6 pm  Typical meal intake: Breakfast is eggs, toast or cereal, Lunch Is a sandwich, evening meal is  chicken and rice   Drinks Recently less regular soft drinks              Dietician visit, most recent: 2013 CDE consultation: 2/18                Exercise:  walking in the morning, 30 minutes, 1-3 times a week  Weight history:  Wt Readings from Last 3 Encounters:  09/27/16 165 lb (74.8 kg)  08/05/16 161 lb 4 oz (73.1 kg)  07/13/15 161 lb (73 kg)    Glycemic control: A1c in 1/18 was 15.5   No results found for: HGBA1C Lab Results  Component Value Date   MICROALBUR 18.1 07/16/2016   CREATININE 0.6 08/25/2016   No results found for: MICRALBCREAT     Allergies as of 09/27/2016      Reactions   Ozempic [semaglutide] Diarrhea   Erythromycin Nausea And Vomiting   Lisinopril Itching, Other (See Comments), Cough   Reaction:  Throat itching       Medication List       Accurate as of 09/27/16  3:17 PM. Always use your most recent med list.          clobetasol ointment 0.05 % Commonly known as:  TEMOVATE Apply 1 application topically daily as needed (for itching).   gabapentin 300 MG  capsule Commonly known as:  NEURONTIN Take 1 capsule (300 mg total) by mouth 3 (three) times daily.   Insulin Pen Needle 31G X 5 MM Misc Use with insulin pen 3 times a day   insulin regular human CONCENTRATED 500 UNIT/ML kwikpen Commonly known as:  HUMULIN R U-500 KWIKPEN 75 Units, 30 minutes before each meal or as directed   losartan 50 MG tablet Commonly known as:  COZAAR Take 50 mg by mouth daily.   simvastatin 40 MG tablet Commonly known as:  ZOCOR Take 40 mg by mouth at bedtime.       Allergies:  Allergies  Allergen Reactions  . Ozempic [Semaglutide] Diarrhea  . Erythromycin Nausea And Vomiting  . Lisinopril Itching, Other (See Comments) and Cough    Reaction:  Throat itching     Past Medical History:  Diagnosis Date  . Diabetes mellitus without complication (HCC)   . Hyperlipidemia   . Hypertension   . Neuromuscular disorder [facility])     Past Surgical History:  Procedure Laterality Date  . TUBAL LIGATION  2003    Family History  Problem Relation Age of Onset  . Hypertension Mother   . Heart disease Mother   . Hypertension Father   . Hypertension Sister   . Hypertension Sister   . Diabetes Maternal Grandfather   . Diabetes Paternal Grandfather     Social History:  reports that she has never smoked. She has never used smokeless tobacco. She reports that she does not drink alcohol or use drugs.   Review of Systems   Lipid history: On simvastatin for several years  Last lipid panel was abnormal with LDL 127 but now she is taking her simvastatin  regularly   No results found for: CHOL, HDL, LDLCALC, LDLDIRECT, TRIG, CHOLHDL         Hypertension: On treatment for the last couple of years, currently on losartan 50 mg  Most recent eye exam was 2017  Most recent foot exam: 1/18  Complications of diabetes: Has symptoms of neuropathy, still having some burning and numbness some relief on gabapentin   Physical Examination:  BP 122/82   Pulse 93    Ht 5' 2.5" (1.588 m)   Wt 165 lb (74.8 kg)   BMI 29.70 kg/m      ASSESSMENT:  Diabetes type 2, insulin resistant, BMI 30     See history of present illness for detailed discussion of current diabetes  management, blood sugar patterns and problems identified  Her blood sugars are dramatically better with trying U-500 insulin 3 times a day Previously was taking about 200 units of premixed insulin without control for several years and blood sugars averaging at least 300-400  More recently she has nearly normal blood sugars although has checked readings mostly in the morning She is also doing better with cutting back on regular soft drinks. She has gained 4 pounds but discussed that this is simply because of much better blood sugar control She has not seen a dietitian and may benefit from further information on meal planning However will need to see if she has adequate postprandial control also with more readings after dinner which she is not doing  HYPERTENSION: Has good control  Hyperlipidemia: Last LDL was above target because of her taking simvastatin irregularly, will need follow-up as she is taking this regularly now   PLAN:    Change the lunchtime dose to 45 units since readings are relatively lower in the afternoon  She can take 40 units in the mornings she gets up early and goes to work  She will need to start checking her blood sugar more consistently at different times of the day including at night  Emphasized the need to take the insulin 30 min before eating at least  Keep portions of carbohydrates like rice small and add protein to each meal  Start walking on the days she is not working  Call if blood sugars are consistently controlled  Check lipids today  Follow-up in 8 weeks   Patient Instructions  45 at lunch  Work days take 40 units in am  Check blood sugars on waking up  4x weekly  Also check blood sugars about 2 hours after a meal and do this after  different meals by rotation  Recommended blood sugar levels on waking up is 90-130 and about 2 hours after meal is 130-160  Please bring your blood sugar monitor to each visit, thank you   Counseling time on subjects discussed above is over 50% of today's 25 minute visit    Christy Ehrsam 09/27/2016, 3:17 PM   Note: This office note was prepared with Dragon voice recognition system technology. Any transcriptional errors that result from this process are unintentional.

## 2016-09-28 LAB — FRUCTOSAMINE: Fructosamine: 323 umol/L — ABNORMAL HIGH (ref 0–285)

## 2016-10-08 ENCOUNTER — Other Ambulatory Visit: Payer: Self-pay

## 2016-10-08 MED ORDER — GABAPENTIN 300 MG PO CAPS: 300.0000 mg | ORAL_CAPSULE | Freq: Three times a day (TID) | ORAL | 2 refills | Status: DC

## 2016-12-27 ENCOUNTER — Other Ambulatory Visit: Payer: BLUE CROSS/BLUE SHIELD

## 2016-12-31 ENCOUNTER — Ambulatory Visit: Payer: BLUE CROSS/BLUE SHIELD | Admitting: Endocrinology

## 2017-02-21 ENCOUNTER — Other Ambulatory Visit: Payer: BLUE CROSS/BLUE SHIELD

## 2017-02-23 ENCOUNTER — Telehealth: Payer: Self-pay | Admitting: Endocrinology

## 2017-02-23 NOTE — Telephone Encounter (Signed)
I have refused this medication with a note to pharmacy change not appropriate.

## 2017-02-23 NOTE — Telephone Encounter (Signed)
Please advise if okay to fill? I do not see this in notes or in medication history.

## 2017-02-23 NOTE — Telephone Encounter (Signed)
She is not taking this, was taking Humulin R U-500

## 2017-02-24 ENCOUNTER — Ambulatory Visit: Payer: BLUE CROSS/BLUE SHIELD | Admitting: Endocrinology

## 2017-03-04 ENCOUNTER — Other Ambulatory Visit (INDEPENDENT_AMBULATORY_CARE_PROVIDER_SITE_OTHER): Payer: Self-pay

## 2017-03-04 DIAGNOSIS — Z794 Long term (current) use of insulin: Secondary | ICD-10-CM

## 2017-03-04 DIAGNOSIS — E1165 Type 2 diabetes mellitus with hyperglycemia: Secondary | ICD-10-CM

## 2017-03-04 LAB — BASIC METABOLIC PANEL
BUN: 14 mg/dL (ref 6–23)
CO2: 28 mEq/L (ref 19–32)
Calcium: 9.3 mg/dL (ref 8.4–10.5)
Chloride: 103 mEq/L (ref 96–112)
Creatinine, Ser: 0.61 mg/dL (ref 0.40–1.20)
GFR: 108.77 mL/min (ref >60.00)
Glucose, Bld: 305 mg/dL — ABNORMAL HIGH (ref 70–99)
Potassium: 3.6 mEq/L (ref 3.5–5.1)
Sodium: 139 mEq/L (ref 135–145)

## 2017-03-04 LAB — HEMOGLOBIN A1C: Hgb A1c MFr Bld: 13.7 % — ABNORMAL HIGH (ref 4.6–6.5)

## 2017-03-08 ENCOUNTER — Ambulatory Visit (INDEPENDENT_AMBULATORY_CARE_PROVIDER_SITE_OTHER): Payer: Self-pay | Admitting: Endocrinology

## 2017-03-08 ENCOUNTER — Encounter: Payer: Self-pay | Admitting: Endocrinology

## 2017-03-08 VITALS — BP 138/80 | HR 93 | Ht 62.5 in | Wt 158.2 lb

## 2017-03-08 DIAGNOSIS — E1165 Type 2 diabetes mellitus with hyperglycemia: Secondary | ICD-10-CM

## 2017-03-08 DIAGNOSIS — Z794 Long term (current) use of insulin: Secondary | ICD-10-CM

## 2017-03-08 NOTE — Progress Notes (Signed)
Patient ID: Tammy Chan, female   DOB: Feb 06, 1966, 51 y.o.   MRN: [id number]          Reason for Appointment:  follow-up  for Type 2 Diabetes  Referring physician: Shirlean Mylar   History of Present Illness:          Date of diagnosis of type 2 diabetes mellitus: 1986       Background history:   She thinks she was started on insulin at time of diagnosis, this was at the age of about 63 She also had tried some oral medications which apparently did not work including metformin Over the years she has taken various insulin regimens including premixed insulin, Lantus and NovoLog Detailed records are not available and not clear what her level of control has been A1c in 2014 and 2015 at [facility] was around 14  Recent history:   INSULIN regimen is: Humulin R U-500 insulin 50 U ac 3x a day  A1c is now 13.7, previously 15.5  Non-insulin hypoglycemic drugs the patient is taking are: None  Current management, blood sugar patterns and problems identified:  She had been taking 50 units as directed but more recently because of her getting low on her supply she was cutting back and currently is not having any insurance  She is keeping with a Generic monitor and did not bring the record of her meter readings   Sugars are much higher at bedtime and sometimes morning also, usually not checking around lunchtime or dinner  Although previously her blood sugars were reportedly averaging 140, her A1c is higher than expected  He is not able to do much exercise because of her legs  She thinks she is generally doing better with her diet and is trying to use more spiking water than regular soft drinks       Side effects from medications have been: Diarrhea from Ozempic, has difficulty swallowing large tablets like metformin  Compliance with the medical regimen: Fair Hypoglycemia:   only if not eating and taking insulin  Glucose monitoring:  done 0-1 times a day         Glucometer:   Relion  Fasting readings 150-180 Hs 200+   Self-care:  Meal times are:  Breakfast is 9 am Lunch: 11 pm Dinner: 6 pm  Typical meal intake: Breakfast is eggs, toast or cereal, Lunch Is a sandwich, evening meal is  chicken and rice   Drinks Recently less regular soft drinks              Dietician visit, most recent: 2013 CDE consultation: 2/18                Exercise:  walking in the morning, 30 minutes, 1-2 times a week  Weight history:  Wt Readings from Last 3 Encounters:  03/08/17 158 lb 3.2 oz (71.8 kg)  09/27/16 165 lb (74.8 kg)  08/05/16 161 lb 4 oz (73.1 kg)    Glycemic control: A1c in 1/18 was 15.5    Lab Results  Component Value Date   HGBA1C 13.7 (H) 03/04/2017   Lab Results  Component Value Date   MICROALBUR 18.1 07/16/2016   LDLCALC 81 09/27/2016   CREATININE 0.61 03/04/2017   No results found for: MICRALBCREAT     Allergies as of 03/08/2017      Reactions   Ozempic [semaglutide] Diarrhea   Erythromycin Nausea And Vomiting   Lisinopril Itching, Other (See Comments), Cough   Reaction:  Throat itching  Medication List       Accurate as of 03/08/17  4:04 PM. Always use your most recent med list.          clobetasol ointment 0.05 % Commonly known as:  TEMOVATE Apply 1 application topically daily as needed (for itching).   gabapentin 300 MG capsule Commonly known as:  NEURONTIN Take 1 capsule (300 mg total) by mouth 3 (three) times daily.   Insulin Pen Needle 31G X 5 MM Misc Use with insulin pen 3 times a day   insulin regular human CONCENTRATED 500 UNIT/ML kwikpen Commonly known as:  HUMULIN R U-500 KWIKPEN 75 Units, 30 minutes before each meal or as directed   losartan 50 MG tablet Commonly known as:  COZAAR Take 50 mg by mouth daily.   simvastatin 40 MG tablet Commonly known as:  ZOCOR Take 40 mg by mouth at bedtime.       Allergies:  Allergies  Allergen Reactions  . Ozempic [Semaglutide] Diarrhea  . Erythromycin Nausea  And Vomiting  . Lisinopril Itching, Other (See Comments) and Cough    Reaction:  Throat itching     Past Medical History:  Diagnosis Date  . Diabetes mellitus without complication (HCC)   . Hyperlipidemia   . Hypertension   . Neuromuscular disorder [facility](HCC)     Past Surgical History:  Procedure Laterality Date  . TUBAL LIGATION  2003    Family History  Problem Relation Age of Onset  . Hypertension Mother   . Heart disease Mother   . Hypertension Father   . Hypertension Sister   . Hypertension Sister   . Diabetes Maternal Grandfather   . Diabetes Paternal Grandfather     Social History:  reports that she has never smoked. She has never used smokeless tobacco. She reports that she does not drink alcohol or use drugs.   Review of Systems   Lipid history: On simvastatin for several years  Last lipid panel was  with LDL 127 but now she is taking her simvastatin  regularly    Lab Results  Component Value Date   CHOL 149 09/27/2016   HDL 56.50 09/27/2016   LDLCALC 81 09/27/2016   TRIG 60.0 09/27/2016   CHOLHDL 3 09/27/2016           Hypertension: On treatment for the last couple of years, currently on losartan 50 mg  Most recent eye exam was 2017  Most recent foot exam: 1/18  Complications of diabetes: Has symptoms of neuropathy, still having some burning and numbness some relief on gabapentin Recently having retinopathy with bleeding complication and going to see a ophthalmologist   Physical Examination:  BP 138/80   Pulse 93   Ht 5' 2.5" (1.588 m)   Wt 158 lb 3.2 oz (71.8 kg)   LMP  (LMP Unknown)   SpO2 90%   BMI 28.47 kg/m      ASSESSMENT:  Diabetes type 2, insulin resistant, BMI 30     See history of present illness for detailed discussion of current diabetes management, blood sugar patterns and problems identified  Her blood sugars are still not controlled despite her using U-500 proving her blood sugars on her last visit This may be related to  her not taking full doses Also she reports much higher readings after dinner Thinks she is doing better on her diet  HYPERTENSION: Has good control  Hyperlipidemia: Last LDL was better   PLAN:    Given a sample of the U-500 insulin  lot number [id number] a  She was shown how to draw up insulin from a 0.3 mL syringe and given her samples  Used 10 units before breakfast and lunch and 12 units before dinner daily and increase for her suppertime dose  If she is running out of the NPH and Regular Insulin from [employer] and call for doses  She will keep a diary of her blood sugars  Check more readings after breakfast and lunch She'll call to report her blood sugars in 2 weeks Most likely may adjust her insulin further based on her blood sugar patterns   Patient Instructions  Take 10 units before Bfst and lunch and 12 before dinner  Check blood sugars on waking up  3/7  Also check blood sugars about 2 hours after a meal and do this after different meals by rotation  Recommended blood sugar levels on waking up is 90-130 and about 2 hours after meal is 130-160  Please bring your blood sugar monitor to each visit, thank you      [facility] 03/08/2017, 4:04 PM   Note: This office note was prepared with Dragon voice recognition system technology. Any transcriptional errors that result from this process are unintentional.

## 2017-03-08 NOTE — Patient Instructions (Addendum)
Take 10 units before Bfst and lunch and 12 before dinner  Check blood sugars on waking up  3/7  Also check blood sugars about 2 hours after a meal and do this after different meals by rotation  Recommended blood sugar levels on waking up is 90-130 and about 2 hours after meal is 130-160  Please bring your blood sugar monitor to each visit, thank you

## 2017-03-19 ENCOUNTER — Emergency Department (HOSPITAL_COMMUNITY): Payer: Worker's Compensation

## 2017-03-19 ENCOUNTER — Emergency Department (HOSPITAL_COMMUNITY)
Admission: EM | Admit: 2017-03-19 | Discharge: 2017-03-19 | Disposition: A | Discharge status: Home / Self Care | Payer: Worker's Compensation | Attending: Emergency Medicine | Admitting: Emergency Medicine

## 2017-03-19 ENCOUNTER — Encounter (HOSPITAL_COMMUNITY): Payer: Self-pay

## 2017-03-19 DIAGNOSIS — E1165 Type 2 diabetes mellitus with hyperglycemia: Secondary | ICD-10-CM | POA: Insufficient documentation

## 2017-03-19 DIAGNOSIS — Y990 Civilian activity done for income or pay: Secondary | ICD-10-CM | POA: Insufficient documentation

## 2017-03-19 DIAGNOSIS — S300XXA Contusion of lower back and pelvis, initial encounter: Secondary | ICD-10-CM

## 2017-03-19 DIAGNOSIS — Y92129 Unspecified place in nursing home as the place of occurrence of the external cause: Secondary | ICD-10-CM | POA: Diagnosis not present

## 2017-03-19 DIAGNOSIS — S0990XA Unspecified injury of head, initial encounter: Secondary | ICD-10-CM | POA: Diagnosis present

## 2017-03-19 DIAGNOSIS — Y9301 Activity, walking, marching and hiking: Secondary | ICD-10-CM | POA: Diagnosis not present

## 2017-03-19 DIAGNOSIS — S161XXA Strain of muscle, fascia and tendon at neck level, initial encounter: Secondary | ICD-10-CM | POA: Diagnosis not present

## 2017-03-19 DIAGNOSIS — S060X0A Concussion without loss of consciousness, initial encounter: Secondary | ICD-10-CM

## 2017-03-19 DIAGNOSIS — W01198A Fall on same level from slipping, tripping and stumbling with subsequent striking against other object, initial encounter: Secondary | ICD-10-CM | POA: Diagnosis not present

## 2017-03-19 DIAGNOSIS — I10 Essential (primary) hypertension: Secondary | ICD-10-CM | POA: Insufficient documentation

## 2017-03-19 DIAGNOSIS — Z79899 Other long term (current) drug therapy: Secondary | ICD-10-CM | POA: Diagnosis not present

## 2017-03-19 HISTORY — DX: Polyneuropathy, unspecified: G62.9

## 2017-03-19 NOTE — ED Notes (Signed)
Pt ambulatory and independent at discharge.  Verbalized understanding of discharge instructions 

## 2017-03-19 NOTE — ED Notes (Signed)
Bed: ZO10 Expected date:  Expected time:  Means of arrival:  Comments: 51 yo fall w/ head injury

## 2017-03-19 NOTE — ED Notes (Signed)
Patient ambulated without difficulty to and from restroom.

## 2017-03-19 NOTE — ED Triage Notes (Signed)
Per EMS- patient  States she slipped on a wet floor while at work,hitting the back of her head. patient does have a hematoma to the back of the head. No other complaints. No LOC, N/v, numbness , or tingling.

## 2017-03-19 NOTE — ED Provider Notes (Signed)
[facility]-EMERGENCY DEPT Provider Note   CSN: [id number] Arrival date & time: 03/19/17  1322     History   Chief Complaint Chief Complaint  Patient presents with  . Fall  . Head Injury     HPI  Blood pressure (!) 171/96, pulse 89, temperature 97.6 F (36.4 C), height 5' 2.75" (1.594 m), weight 70.8 kg (156 lb), SpO2 99 %.  Tammy Chan is a 51 y.o. female complaining of occipital headache status post mechanical fall just prior to arrival. 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 She's not anticoagulated, there was no loss of consciousness. She denies any nausea, vomiting, change in vision she states that she has a funny sensation in her neck. She denies chest pain, abdominal pain but endorses a severe coccyx pain. She's been ambulatory since the event. Pain 7 out of 10, no medication taken prior to arrival.  Past Medical History:  Diagnosis Date  . Diabetes mellitus without complication (HCC)   . Hyperlipidemia   . Hypertension   . Neuromuscular disorder (HCC)   . Neuropathy     Patient Active Problem List   Diagnosis Date Noted  . Uncontrolled type 2 diabetes mellitus with hyperglycemia, with long-term current use of insulin (HCC) 08/05/2016  . Left genital labial abscess 02/06/2014    Past Surgical History:  Procedure Laterality Date  . TUBAL LIGATION  2003    OB History    Gravida Para Term Preterm AB Living   SAB TAB Ectopic Multiple Live Births                   Home Medications    Prior to Admission medications   Medication Sig Start Date End Date Taking? Authorizing Provider  clobetasol ointment (TEMOVATE) 0.05 % Apply 1 application topically daily as needed (for itching).    [provider]  gabapentin (NEURONTIN) 300 MG capsule Take 1 capsule (300 mg total) by mouth 3 (three) times daily. 10/08/16   Reather Littler, MD  Insulin Pen  Needle 31G X 5 MM MISC Use with insulin pen 3 times a day Patient not taking: Reported on 03/08/2017 08/05/16   Reather Littler, MD  insulin regular human CONCENTRATED (HUMULIN R U-500 KWIKPEN) 500 UNIT/ML kwikpen 75 Units, 30 minutes before each meal or as directed Patient not taking: Reported on 03/08/2017 08/05/16   Reather Littler, MD  losartan (COZAAR) 50 MG tablet Take 50 mg by mouth daily.    [provider]  simvastatin (ZOCOR) 40 MG tablet Take 40 mg by mouth at bedtime.    [provider]    Family History Family History  Problem Relation Age of Onset  . Hypertension Mother   . Heart disease Mother   . Hypertension Father   . Hypertension Sister   . Hypertension Sister   . Diabetes Maternal Grandfather   . Diabetes Paternal Grandfather     Social History Social History  Substance Use Topics  . Smoking status: Never Smoker  . Smokeless tobacco: Never Used  . Alcohol use No     Comment: socical     Allergies   Ozempic [semaglutide]; Erythromycin; and Lisinopril   Review of Systems Review of Systems  A complete review of systems was obtained and all systems are negative except as noted in the HPI and PMH.  Physical Exam Updated Vital Signs BP (!) 171/96 (BP Location: Left Arm)   Pulse 89   Temp 97.6 F (36.4 C)   Ht 5' 2.75" (1.594 m)   Wt 70.8 kg (156 lb)   LMP  (LMP Unknown)   SpO2 99%   BMI 27.85 kg/m   Physical Exam  Constitutional: She is oriented to person, place, and time. She appears well-developed and well-nourished. No distress.  HENT:  Head: Normocephalic and atraumatic.  Mouth/Throat: Oropharynx is clear and moist.  Eyes: Pupils are equal, round, and reactive to light. Conjunctivae and EOM are normal.  Neck: Normal range of motion.  + midline C-spine  tenderness to palpation No step-offs appreciated.  Grip strength, biceps, triceps 5/5 bilaterally;  can differentiate between pinprick and light touch bilaterally.   No  anteriolateral hematomas/bruits   Cardiovascular: Normal rate, regular rhythm and intact distal pulses.   Pulmonary/Chest: Effort normal and breath sounds normal.  Abdominal: Soft. There is no tenderness.  Musculoskeletal: Normal range of motion.       Back:  Neurological: She is alert and oriented to person, place, and time.  Skin: Capillary refill takes less than 2 seconds. She is not diaphoretic.  Psychiatric: She has a normal mood and affect.  Nursing note and vitals reviewed.    ED Treatments / Results  Labs (all labs ordered are listed, but only abnormal results are displayed) Labs Reviewed - No data to display  EKG  EKG Interpretation None       Radiology Dg Sacrum/coccyx  Result Date: 03/19/2017 CLINICAL DATA:  Pain after trauma EXAM: SACRUM AND COCCYX - 2+ VIEW COMPARISON:  None. FINDINGS: There is no evidence of fracture or other focal bone lesions. IMPRESSION: Negative. Electronically Signed   By: Gerome Sam III M.D   On: 03/19/2017 14:21   Ct Head Wo Contrast  Result Date: 03/19/2017 CLINICAL DATA:  Trauma EXAM: CT HEAD WITHOUT CONTRAST CT CERVICAL SPINE WITHOUT CONTRAST TECHNIQUE: Multidetector CT imaging of the head and cervical spine was performed following the standard protocol without intravenous contrast. Multiplanar CT image reconstructions of the cervical spine were also generated. COMPARISON:  None. FINDINGS: CT HEAD FINDINGS Brain: No evidence of acute infarction, hemorrhage, hydrocephalus, extra-axial collection or mass lesion/mass effect. Vascular: No hyperdense vessel or unexpected calcification. Skull: Negative Sinuses/Orbits: Negative Other: None CT CERVICAL SPINE FINDINGS Alignment: Normal Skull base and vertebrae: Negative for fracture Soft tissues and spinal canal: Negative Disc levels:  Normal Upper chest: Negative Other: None IMPRESSION: Negative CT head and cervical spine Electronically Signed   By: Marlan Palau M.D.   On: 03/19/2017 14:37    Ct Cervical Spine Wo Contrast  Result Date: 03/19/2017 CLINICAL DATA:  Trauma EXAM: CT HEAD WITHOUT CONTRAST CT CERVICAL SPINE WITHOUT CONTRAST TECHNIQUE: Multidetector CT imaging of the head and cervical spine was performed following the standard protocol without intravenous contrast. Multiplanar CT image reconstructions of the cervical spine were also generated. COMPARISON:  None. FINDINGS: CT HEAD FINDINGS Brain: No evidence of acute infarction, hemorrhage, hydrocephalus, extra-axial collection or mass lesion/mass effect. Vascular: No hyperdense vessel or unexpected calcification. Skull: Negative Sinuses/Orbits: Negative Other: None CT CERVICAL SPINE FINDINGS Alignment: Normal Skull base and vertebrae: Negative for fracture Soft tissues and spinal canal: Negative Disc levels:  Normal Upper chest: Negative Other: None IMPRESSION: Negative CT head and cervical spine Electronically Signed   By: Marlan Palau M.D.   On: 03/19/2017 14:37    Procedures Procedures (including critical care time)  Medications Ordered  in ED Medications - No data to display   Initial Impression / Assessment and Plan / ED Course  I have reviewed the triage vital signs and the nursing notes.  Pertinent labs & imaging results that were available during my care of the patient were reviewed by me and considered in my medical decision making (see chart for details).     Vitals:   03/19/17 1335 03/19/17 1336  BP:  (!) 171/96  Pulse:  89  Temp:  97.6 F (36.4 C)  SpO2:  99%  Weight: 70.8 kg (156 lb)   Height: 5' 2.75" (1.594 m)      Tammy Chan is 50 y.o. female presenting with Mechanical fall while at work from standing. She fell backwards and hit the occipital aspect of her head. Nonfocal neurologic exam, she's not anticoagulated. She fails Nexus and she is also reporting some sacral pain. Head, cervical spine and plain negative. He states that she is not scheduled to return to work until Tuesday and feels  comfortable returning at that time, she declines pain medication in the ED recommends NSAIDs at home for symptomatic relief.  Evaluation does not show pathology that would require ongoing emergent intervention or inpatient treatment. Pt is hemodynamically stable and mentating appropriately. Discussed findings and plan with patient/guardian, who agrees with care plan. All questions answered. Return precautions discussed and outpatient follow up given.    Final Clinical Impressions(s) / ED Diagnoses   Final diagnoses:  Concussion without loss of consciousness, initial encounter  Sacral contusion, initial encounter  Cervical strain, acute, initial encounter    New Prescriptions New Prescriptions   No medications on file     Kaylyn Lim 03/19/17 1458    Raeford Razor, MD 03/20/17 [phone]

## 2017-03-19 NOTE — Discharge Instructions (Signed)
Rest, Ice intermittently (in the first 24-48 hours), Gentle compression with an Ace wrap, and elevate (Limb above the level of the heart) °  °Take up to 800mg of ibuprofen (that is usually 4 over the counter pills)  3 times a day for 5 days. Take with food. ° °Please follow with your primary care doctor in the next 2 days for a check-up. They must obtain records for further management.  ° °Do not hesitate to return to the Emergency Department for any new, worsening or concerning symptoms.  ° °

## 2017-03-31 ENCOUNTER — Other Ambulatory Visit: Payer: BLUE CROSS/BLUE SHIELD

## 2017-04-05 ENCOUNTER — Ambulatory Visit: Payer: BLUE CROSS/BLUE SHIELD | Admitting: Endocrinology

## 2017-07-02 ENCOUNTER — Other Ambulatory Visit: Payer: Self-pay | Admitting: Endocrinology

## 2017-07-02 DIAGNOSIS — E1165 Type 2 diabetes mellitus with hyperglycemia: Secondary | ICD-10-CM

## 2017-07-02 DIAGNOSIS — Z794 Long term (current) use of insulin: Principal | ICD-10-CM

## 2017-07-05 ENCOUNTER — Other Ambulatory Visit: Payer: Self-pay

## 2017-07-08 ENCOUNTER — Ambulatory Visit: Payer: Self-pay | Admitting: Endocrinology

## 2017-07-08 DIAGNOSIS — Z0289 Encounter for other administrative examinations: Secondary | ICD-10-CM

## 2017-08-30 ENCOUNTER — Other Ambulatory Visit (INDEPENDENT_AMBULATORY_CARE_PROVIDER_SITE_OTHER): Payer: Self-pay

## 2017-08-30 DIAGNOSIS — E1165 Type 2 diabetes mellitus with hyperglycemia: Secondary | ICD-10-CM

## 2017-08-30 DIAGNOSIS — Z794 Long term (current) use of insulin: Secondary | ICD-10-CM

## 2017-08-30 LAB — COMPREHENSIVE METABOLIC PANEL
ALT: 24 U/L (ref 0–35)
AST: 20 U/L (ref 0–37)
Albumin: 3.6 g/dL (ref 3.5–5.2)
Alkaline Phosphatase: 141 U/L — ABNORMAL HIGH (ref 39–117)
BUN: 14 mg/dL (ref 6–23)
CO2: 27 mEq/L (ref 19–32)
Calcium: 9.3 mg/dL (ref 8.4–10.5)
Chloride: 106 mEq/L (ref 96–112)
Creatinine, Ser: 0.49 mg/dL (ref 0.40–1.20)
GFR: 139.79 mL/min (ref >60.00)
Glucose, Bld: 276 mg/dL — ABNORMAL HIGH (ref 70–99)
Potassium: 4.2 mEq/L (ref 3.5–5.1)
Sodium: 138 mEq/L (ref 135–145)
Total Bilirubin: 0.2 mg/dL (ref 0.2–1.2)
Total Protein: 6.8 g/dL (ref 6.0–8.3)

## 2017-08-30 LAB — MICROALBUMIN / CREATININE URINE RATIO
Creatinine,U: 82.7 mg/dL
Microalb Creat Ratio: 1.2 mg/g (ref 0.0–30.0)
Microalb, Ur: 1 mg/dL (ref 0.0–1.9)

## 2017-08-30 LAB — HEMOGLOBIN A1C: Hgb A1c MFr Bld: 13.2 % — ABNORMAL HIGH (ref 4.6–6.5)

## 2017-09-01 ENCOUNTER — Ambulatory Visit: Payer: Self-pay | Admitting: Endocrinology

## 2017-09-01 ENCOUNTER — Encounter: Payer: Self-pay | Admitting: Endocrinology

## 2017-09-01 VITALS — BP 128/82 | HR 83 | Wt 171.2 lb

## 2017-09-01 DIAGNOSIS — E1165 Type 2 diabetes mellitus with hyperglycemia: Secondary | ICD-10-CM | POA: Diagnosis not present

## 2017-09-01 DIAGNOSIS — Z794 Long term (current) use of insulin: Secondary | ICD-10-CM

## 2017-09-01 DIAGNOSIS — I10 Essential (primary) hypertension: Secondary | ICD-10-CM

## 2017-09-01 DIAGNOSIS — R635 Abnormal weight gain: Secondary | ICD-10-CM

## 2017-09-01 MED ORDER — INSULIN REGULAR HUMAN (CONC) 500 UNIT/ML ~~LOC~~ SOPN: PEN_INJECTOR | SUBCUTANEOUS | 1 refills | Status: DC

## 2017-09-01 MED ORDER — CANAGLIFLOZIN 100 MG PO TABS: ORAL_TABLET | ORAL | 3 refills | Status: DC

## 2017-09-01 NOTE — Patient Instructions (Addendum)
Check blood sugars on waking up  3/7 days  Also check blood sugars about 2 hours after a meal and do this after different meals by rotation  Recommended blood sugar levels on waking up is 90-130 and about 2 hours after meal is 130-160  Please bring your blood sugar monitor to each visit, thank you  Check coverage for meter  Insulin 75 in am, 100 at lunch and 75 at pm meal  Invokana on waking before meal   Take 1/2 Losartan

## 2017-09-01 NOTE — Progress Notes (Signed)
Patient ID: Tammy Chan, female   DOB: 23-Feb-1966, 52 y.o.   MRN: [id number]          Reason for Appointment:  follow-up  for Type 2 Diabetes  Referring physician: Shirlean Mylar   History of Present Illness:          Date of diagnosis of type 2 diabetes mellitus: 1986       Background history:   She thinks she was started on insulin at time of diagnosis, this was at the age of about 48 She also had tried some oral medications which apparently did not work including metformin Over the years she has taken various insulin regimens including premixed insulin, Lantus and NovoLog Detailed records are not available and not clear what her level of control has been A1c in 2014 and 2015 at [facility] was around 14  Recent history:   INSULIN regimen is: Humulin R U-500 insulin with syringe 12--14--15 U ac 3x a day  A1c is now 13.2 and about the same as before   Non-insulin hypoglycemic drugs the patient is taking are: None  Current management, blood sugar patterns and problems identified:  She has not had a regular follow-up and is now coming back after almost 6 months  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  Again she is not keeping a record of her blood sugars and still using a different generic monitors  Even though she thinks she is trying to take her Humulin R regularly as directed she is not having any improvement in her blood sugars  Also is not consistently following her diet this is especially at lunchtime when she may some fried chicken sandwiches  She is however trying to cut back on her regular soft drinks and using more sparkly water  Not doing any formal exercise, she thinks she is on her feet at work and doing some walking with her college classes  Has gained a significant amount of weight since her last visit  Lab blood sugar was 276 midmorning  She thinks her highest blood sugars are before dinnertime and may be over 600  Blood sugars in the mornings and  around lunchtime are still around 200-250       Side effects from medications have been: Diarrhea from Ozempic, nausea from metformin, has difficulty swallowing large tablets like metformin  Mealtimes: Breakfast at 7 AM, lunch around 2 PM and dinner at 11PM  Compliance with the medical regimen: Fair Hypoglycemia:  None  Glucose monitoring:  done 0-1 times a day         Glucometer:  Relion  Readings as above   Self-care:  Meal times are:  Breakfast is 9 am Lunch: 11 pm Dinner: 6 pm  Typical meal intake: Breakfast is eggs, toast or cereal, Lunch Is a sandwich, evening meal is  chicken and some rice   Drinks Recently less regular soft drinks              Dietician visit, most recent: 2013  CDE consultation: 2/18                Exercise:  walking at work  Weight history:  Wt Readings from Last 3 Encounters:  09/01/17 171 lb 3.2 oz (77.7 kg)  03/19/17 156 lb (70.8 kg)  03/08/17 158 lb 3.2 oz (71.8 kg)    Glycemic control: A1c in 1/18 was 15.5    Lab Results  Component Value Date   HGBA1C 13.2 (H) 08/30/2017  HGBA1C 13.7 (H) 03/04/2017   Lab Results  Component Value Date   MICROALBUR 1.0 08/30/2017   LDLCALC 81 09/27/2016   CREATININE 0.49 08/30/2017   Lab Results  Component Value Date   MICRALBCREAT 1.2 08/30/2017   Lab Results  Component Value Date   FRUCTOSAMINE 323 (H) 09/27/2016       Allergies as of 09/01/2017      Reactions   Ozempic [semaglutide] Diarrhea   Erythromycin Nausea And Vomiting   Lisinopril Itching, Other (See Comments), Cough   Reaction:  Throat itching       Medication List        Accurate as of 09/01/17  8:39 PM. Always use your most recent med list.          canagliflozin 100 MG Tabs tablet Commonly known as:  INVOKANA 1 tablet before breakfast   clobetasol ointment 0.05 % Commonly known as:  TEMOVATE Apply 1 application topically daily as needed (for itching).   gabapentin 300 MG capsule Commonly known as:   NEURONTIN Take 1 capsule (300 mg total) by mouth 3 (three) times daily.   Insulin Pen Needle 31G X 5 MM Misc Use with insulin pen 3 times a day   insulin regular human CONCENTRATED 500 UNIT/ML kwikpen Commonly known as:  HUMULIN R U-500 KWIKPEN 100 Units, 30 minutes before each meal or as directed   losartan 50 MG tablet Commonly known as:  COZAAR Take 50 mg by mouth daily.   simvastatin 40 MG tablet Commonly known as:  ZOCOR Take 40 mg by mouth at bedtime.       Allergies:  Allergies  Allergen Reactions  . Ozempic [Semaglutide] Diarrhea  . Erythromycin Nausea And Vomiting  . Lisinopril Itching, Other (See Comments) and Cough    Reaction:  Throat itching     Past Medical History:  Diagnosis Date  . Diabetes mellitus without complication (HCC)   . Hyperlipidemia   . Hypertension   . Neuromuscular disorder (HCC)   . Neuropathy     Past Surgical History:  Procedure Laterality Date  . TUBAL LIGATION  2003    Family History  Problem Relation Age of Onset  . Hypertension Mother   . Heart disease Mother   . Hypertension Father   . Hypertension Sister   . Hypertension Sister   . Diabetes Maternal Grandfather   . Diabetes Paternal Grandfather     Social History:  reports that  has never smoked. she has never used smokeless tobacco. She reports that she does not drink alcohol or use drugs.   Review of Systems  Genitourinary: Negative for slow stream.     Lipid history: On simvastatin for several years  Last lipid panel was       Lab Results  Component Value Date   CHOL 149 09/27/2016   HDL 56.50 09/27/2016   LDLCALC 81 09/27/2016   TRIG 60.0 09/27/2016   CHOLHDL 3 09/27/2016           Hypertension: On treatment for the last couple of years, currently on losartan 50 mg  Most recent eye exam was 2017  Most recent foot exam: 1/18  Complications of diabetes: Has symptoms of neuropathy, still having some burning and numbness some relief on  gabapentin Recently having retinopathy with bleeding complication and going to see a ophthalmologist   Physical Examination:  BP 128/82 (BP Location: Left Arm, Patient Position: Sitting, Cuff Size: Large)   Pulse 83   Wt 171 lb 3.2 oz (  77.7 kg)   LMP  (LMP Unknown)   SpO2 96%   BMI 30.57 kg/m      ASSESSMENT:  Diabetes type 2, insulin resistant, BMI 30     See history of present illness for detailed discussion of current diabetes management, blood sugar patterns and problems identified  A1c is still over 13%  This is despite her trying to take her Humulin R fairly regularly Still continues to be insulin resistant with taking the equivalent of nearly 70 units 3 times a day without any insulin sensitizer  This is his however causing significant weight gain Her diet can be better to especially when eating out Most likely she needs an additional agent to help her with blood sugar control without causing further weight gain and needing escalating doses of insulin She can also do better with exercise She reports inability to take metformin  She will be a good candidate for adding a medication like Invokana  HYPERTENSION: Has good control with losartan  Hyperlipidemia: Needs follow-up    PLAN:   Discussed action of SGLT 2 drugs on lowering glucose by decreasing kidney absorption of glucose, benefits of weight loss and lower blood pressure, possible side effects including candidiasis and dosage regimen   She will start with Invokana 100 mg daily  Discussed timing of taking Humulin R insulin and adjustment of the doses based on blood sugars before the next meal which she is not doing  Since her HIGHEST blood sugars are reportedly before her evening meal she will need to increase her lunchtime dose to 100 units on the pen or 20 units with the syringe, may need to adjust this further  She will also take 75 units to start with before her morning meal and at least 75with the  evening meal  Given a co-pay card for the  the U-500 insulin  Encourage her to do any kind of formal exercise at least on the day she is not working  With starting Invokana she will cut her losartan in half  She does need to check with her insurance about the covered brand of her glucose monitor and switch to this for better assessment of her glucose and ability to download  Reminded her to start using low-fat options for meals especially lunchtime    Patient Instructions  Check blood sugars on waking up  3/7 days  Also check blood sugars about 2 hours after a meal and do this after different meals by rotation  Recommended blood sugar levels on waking up is 90-130 and about 2 hours after meal is 130-160  Please bring your blood sugar monitor to each visit, thank you  Check coverage for meter  Insulin 75 in am, 100 at lunch and 75 at pm meal  Invokana on waking before meal   Take 1/2 Losartan   Counseling time on subjects discussed in assessment and plan sections is over 50% of today's 25 minute visit  Reather Littler 09/01/2017, 8:39 PM   Note: This office note was prepared with Dragon voice recognition system technology. Any transcriptional errors that result from this process are unintentional.

## 2017-09-27 ENCOUNTER — Other Ambulatory Visit: Payer: Self-pay

## 2017-09-30 ENCOUNTER — Other Ambulatory Visit (INDEPENDENT_AMBULATORY_CARE_PROVIDER_SITE_OTHER): Payer: BLUE CROSS/BLUE SHIELD

## 2017-09-30 ENCOUNTER — Ambulatory Visit: Payer: Self-pay | Admitting: Endocrinology

## 2017-09-30 DIAGNOSIS — E1165 Type 2 diabetes mellitus with hyperglycemia: Secondary | ICD-10-CM

## 2017-09-30 DIAGNOSIS — Z794 Long term (current) use of insulin: Secondary | ICD-10-CM | POA: Diagnosis not present

## 2017-09-30 LAB — BASIC METABOLIC PANEL
BUN: 11 mg/dL (ref 6–23)
CO2: 28 mEq/L (ref 19–32)
Calcium: 9.2 mg/dL (ref 8.4–10.5)
Chloride: 105 mEq/L (ref 96–112)
Creatinine, Ser: 0.45 mg/dL (ref 0.40–1.20)
GFR: 154.18 mL/min (ref >60.00)
Glucose, Bld: 248 mg/dL — ABNORMAL HIGH (ref 70–99)
Potassium: 3.4 mEq/L — ABNORMAL LOW (ref 3.5–5.1)
Sodium: 139 mEq/L (ref 135–145)

## 2017-09-30 LAB — LIPID PANEL
Cholesterol: 169 mg/dL (ref 0–200)
HDL: 57.7 mg/dL (ref >39.00)
LDL Cholesterol: 95 mg/dL (ref 0–99)
NonHDL: 111.52
Total CHOL/HDL Ratio: 3
Triglycerides: 81 mg/dL (ref 0.0–149.0)
VLDL: 16.2 mg/dL (ref 0.0–40.0)

## 2017-10-01 LAB — FRUCTOSAMINE: Fructosamine: 418 umol/L — ABNORMAL HIGH (ref 0–285)

## 2017-10-04 NOTE — Progress Notes (Deleted)
Patient ID: Tammy Chan, female   DOB: [birth year], 52 y.o.   MRN: [id number]          Reason for Appointment:  follow-up  for Type 2 Diabetes  Referring physician: Shirlean Mylar   History of Present Illness:          Date of diagnosis of type 2 diabetes mellitus: 1986       Background history:   She thinks she was started on insulin at time of diagnosis, this was at the age of about 34 She also had tried some oral medications which apparently did not work including metformin Over the years she has taken various insulin regimens including premixed insulin, Lantus and NovoLog Detailed records are not available and not clear what her level of control has been A1c in 2014 and 2015 at [facility] was around 14  Recent history:   INSULIN regimen is: Humulin R U-500 insulin with syringe 12--14--15 U ac 3x a day  A1c is now 13.2 and about the same as before   Non-insulin hypoglycemic drugs the patient is taking are: None  Current management, blood sugar patterns and problems identified:  She has not had a regular follow-up and is now coming back after almost 6 months  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  Again she is not keeping a record of her blood sugars and still using a different generic monitors  Even though she thinks she is trying to take her Humulin R regularly as directed she is not having any improvement in her blood sugars  Also is not consistently following her diet this is especially at lunchtime when she may some fried chicken sandwiches  She is however trying to cut back on her regular soft drinks and using more sparkly water  Not doing any formal exercise, she thinks she is on her feet at work and doing some walking with her college classes  Has gained a significant amount of weight since her last visit  Lab blood sugar was 276 midmorning  She thinks her highest blood sugars are before dinnertime and may be over 600  Blood sugars in the mornings and  around lunchtime are still around 200-250       Side effects from medications have been: Diarrhea from Ozempic, nausea from metformin, has difficulty swallowing large tablets like metformin  Mealtimes: Breakfast at 7 AM, lunch around 2 PM and dinner at 11PM  Compliance with the medical regimen: Fair Hypoglycemia:  None  Glucose monitoring:  done 0-1 times a day         Glucometer:  Relion  Readings as above   Self-care:  Meal times are:  Breakfast is 9 am Lunch: 11 pm Dinner: 6 pm  Typical meal intake: Breakfast is eggs, toast or cereal, Lunch Is a sandwich, evening meal is  chicken and some rice   Drinks Recently less regular soft drinks              Dietician visit, most recent: 2013  CDE consultation: 2/18                Exercise:  walking at work  Weight history:  Wt Readings from Last 3 Encounters:  09/01/17 171 lb 3.2 oz (77.7 kg)  03/19/17 156 lb (70.8 kg)  03/08/17 158 lb 3.2 oz (71.8 kg)    Glycemic control: A1c in 1/18 was 15.5    Lab Results  Component Value Date   HGBA1C 13.2 (H) 08/30/2017  HGBA1C 13.7 (H) 03/04/2017   Lab Results  Component Value Date   MICROALBUR 1.0 08/30/2017   LDLCALC 95 09/30/2017   CREATININE 0.45 09/30/2017   Lab Results  Component Value Date   MICRALBCREAT 1.2 08/30/2017   Lab Results  Component Value Date   FRUCTOSAMINE 418 (H) 09/30/2017   FRUCTOSAMINE 323 (H) 09/27/2016       Allergies as of 10/05/2017      Reactions   Ozempic [semaglutide] Diarrhea   Erythromycin Nausea And Vomiting   Lisinopril Itching, Other (See Comments), Cough   Reaction:  Throat itching       Medication List        Accurate as of 10/04/17  9:47 PM. Always use your most recent med list.          canagliflozin 100 MG Tabs tablet Commonly known as:  INVOKANA 1 tablet before breakfast   clobetasol ointment 0.05 % Commonly known as:  TEMOVATE Apply 1 application topically daily as needed (for itching).   gabapentin 300 MG  capsule Commonly known as:  NEURONTIN Take 1 capsule (300 mg total) by mouth 3 (three) times daily.   Insulin Pen Needle 31G X 5 MM Misc Use with insulin pen 3 times a day   insulin regular human CONCENTRATED 500 UNIT/ML kwikpen Commonly known as:  HUMULIN R U-500 KWIKPEN 100 Units, 30 minutes before each meal or as directed   losartan 50 MG tablet Commonly known as:  COZAAR Take 50 mg by mouth daily.   simvastatin 40 MG tablet Commonly known as:  ZOCOR Take 40 mg by mouth at bedtime.       Allergies:  Allergies  Allergen Reactions  . Ozempic [Semaglutide] Diarrhea  . Erythromycin Nausea And Vomiting  . Lisinopril Itching, Other (See Comments) and Cough    Reaction:  Throat itching     Past Medical History:  Diagnosis Date  . Diabetes mellitus without complication (HCC)   . Hyperlipidemia   . Hypertension   . Neuromuscular disorder (HCC)   . Neuropathy     Past Surgical History:  Procedure Laterality Date  . TUBAL LIGATION  2003    Family History  Problem Relation Age of Onset  . Hypertension Mother   . Heart disease Mother   . Hypertension Father   . Hypertension Sister   . Hypertension Sister   . Diabetes Maternal Grandfather   . Diabetes Paternal Grandfather     Social History:  reports that she has never smoked. She has never used smokeless tobacco. She reports that she does not drink alcohol or use drugs.   Review of Systems  Genitourinary: Negative for slow stream.     Lipid history: On simvastatin for several years  Last lipid panel was       Lab Results  Component Value Date   CHOL 169 09/30/2017   HDL 57.70 09/30/2017   LDLCALC 95 09/30/2017   TRIG 81.0 09/30/2017   CHOLHDL 3 09/30/2017           Hypertension: On treatment for the last couple of years, currently on losartan 50 mg  Most recent eye exam was 2017  Most recent foot exam: 1/18  Complications of diabetes: Has symptoms of neuropathy, still having some burning and  numbness some relief on gabapentin Recently having retinopathy with bleeding complication and going to see a ophthalmologist   Physical Examination:  LMP  (LMP Unknown)      ASSESSMENT:  Diabetes type 2, insulin resistant, BMI  30     See history of present illness for detailed discussion of current diabetes management, blood sugar patterns and problems identified  A1c is still over 13%  This is despite her trying to take her Humulin R fairly regularly Still continues to be insulin resistant with taking the equivalent of nearly 70 units 3 times a day without any insulin sensitizer  This is his however causing significant weight gain Her diet can be better to especially when eating out Most likely she needs an additional agent to help her with blood sugar control without causing further weight gain and needing escalating doses of insulin She can also do better with exercise She reports inability to take metformin  She will be a good candidate for adding a medication like Invokana  HYPERTENSION: Has good control with losartan  Hyperlipidemia: Needs follow-up    PLAN:   Discussed action of SGLT 2 drugs on lowering glucose by decreasing kidney absorption of glucose, benefits of weight loss and lower blood pressure, possible side effects including candidiasis and dosage regimen   She will start with Invokana 100 mg daily  Discussed timing of taking Humulin R insulin and adjustment of the doses based on blood sugars before the next meal which she is not doing  Since her HIGHEST blood sugars are reportedly before her evening meal she will need to increase her lunchtime dose to 100 units on the pen or 20 units with the syringe, may need to adjust this further  She will also take 75 units to start with before her morning meal and at least 75with the evening meal  Given a co-pay card for the  the U-500 insulin  Encourage her to do any kind of formal exercise at least on the day  she is not working  With starting Invokana she will cut her losartan in half  She does need to check with her insurance about the covered brand of her glucose monitor and switch to this for better assessment of her glucose and ability to download  Reminded her to start using low-fat options for meals especially lunchtime    There are no Patient Instructions on file for this visit.  Counseling time on subjects discussed in assessment and plan sections is over 50% of today's 25 minute visit  Reather Littler 10/04/2017, 9:47 PM   Note: This office note was prepared with Dragon voice recognition system technology. Any transcriptional errors that result from this process are unintentional.

## 2017-10-05 ENCOUNTER — Ambulatory Visit: Payer: Self-pay | Admitting: Endocrinology

## 2017-10-05 DIAGNOSIS — Z0289 Encounter for other administrative examinations: Secondary | ICD-10-CM

## 2017-10-10 ENCOUNTER — Encounter: Payer: Self-pay | Admitting: Endocrinology

## 2017-10-10 ENCOUNTER — Ambulatory Visit: Payer: BLUE CROSS/BLUE SHIELD | Admitting: Endocrinology

## 2017-10-10 VITALS — BP 142/88 | HR 88 | Ht 62.75 in | Wt 171.6 lb

## 2017-10-10 DIAGNOSIS — Z794 Long term (current) use of insulin: Secondary | ICD-10-CM

## 2017-10-10 DIAGNOSIS — E1165 Type 2 diabetes mellitus with hyperglycemia: Secondary | ICD-10-CM

## 2017-10-10 MED ORDER — CANAGLIFLOZIN 100 MG PO TABS: ORAL_TABLET | ORAL | 3 refills | Status: DC

## 2017-10-10 MED ORDER — METFORMIN HCL 500 MG/5ML PO SOLN: ORAL | 1 refills | Status: DC

## 2017-10-10 NOTE — Patient Instructions (Addendum)
Insulin 100 unit in am , 150 in afternoon and 100 units at nite  Check blood sugars on waking up  3/7   Also check blood sugars about 2 hours after a meal and do this after different meals by rotation  Recommended blood sugar levels on waking up is 90-130 and about 2 hours after meal is 130-160  Please bring your blood sugar monitor to each visit, thank you  Find out what meter is covered  Start taking Metformin 500 mg, 1 tsp  with your main meal for 5 days. Occasionally this may initially cause loose stools or nausea. If  tolerating well after 5 days add a second Metformin tablet (500 mg) at the same time. Continue adding another tablet after 5 days days if no persistent nausea or diarrhea until reaching the maximum tolerated dose

## 2017-10-10 NOTE — Progress Notes (Signed)
Patient ID: Tammy Chan, female   DOB: [birth year], 52 y.o.   MRN: [id number]          Reason for Appointment:  follow-up  for Type 2 Diabetes  Referring physician: Shirlean Mylar   History of Present Illness:          Date of diagnosis of type 2 diabetes mellitus: 1986       Background history:   She thinks she was started on insulin at time of diagnosis, this was at the age of about 40 She also had tried some oral medications which apparently did not work including metformin Over the years she has taken various insulin regimens including premixed insulin, Lantus and NovoLog Detailed records are not available and not clear what her level of control has been A1c in 2014 and 2015 at [facility] was around 14  Recent history:   INSULIN regimen is: Humulin R U-500 insulin with 70-100-70  ac 3x a day  A1c is 13.2 in March and about the same as before Fructosamine is now 418  She works night shifts  Non-insulin hypoglycemic drugs the patient is taking are: None  Current management, blood sugar patterns and problems identified:  She has not brought her monitor for download again  Still using the [employer] brand monitor and has not checked on the coverage for brand-name meters  She thinks that [facility] was not covered by her insurance even though it appears to be and she did not let us know after this was prescribed in March  Does not think her sugars are much better even though she has increased her insulin  She is still taking her insulin 3 times a day even though she works night shifts and usually not eating a meal in the morning unless she is not working  She thinks her HIGHEST blood sugars are late in the evening especially when working and is taking a larger dose with her first meal in the afternoon when working  Has difficulty losing weight although this has leveled off now       Side effects from medications have been: Diarrhea from Ozempic, nausea from metformin, has  difficulty swallowing large tablets like metformin  Mealtimes:  lunch around 4 PM and dinner at 11PM Shots 6am, 3 pm and 11pm   Compliance with the medical regimen: Fair Hypoglycemia:  None  Glucose monitoring:  done 0-1 times a day         Glucometer:  Relion  Readings range from 275-500   Self-care:  Meal times are:  Breakfast is 9 am Lunch: 11 pm Dinner: 6 pm  Typical meal intake: Breakfast is eggs, toast or cereal, Lunch Is a sandwich, evening meal is  chicken and some rice   Drinks Recently less regular soft drinks              Dietician visit, most recent: 2013  CDE consultation: 2/18                Exercise:  walking at work  Weight history:  Wt Readings from Last 3 Encounters:  10/10/17 171 lb 9.6 oz (77.8 kg)  09/01/17 171 lb 3.2 oz (77.7 kg)  03/19/17 156 lb (70.8 kg)    Glycemic control: A1c in 1/18 was 15.5    Lab Results  Component Value Date   HGBA1C 13.2 (H) 08/30/2017   HGBA1C 13.7 (H) 03/04/2017   Lab Results  Component Value Date   MICROALBUR 1.0 08/30/2017   LDLCALC 95  09/30/2017   CREATININE 0.45 09/30/2017   Lab Results  Component Value Date   MICRALBCREAT 1.2 08/30/2017   Lab Results  Component Value Date   FRUCTOSAMINE 418 (H) 09/30/2017   FRUCTOSAMINE 323 (H) 09/27/2016       Allergies as of 10/10/2017      Reactions   Ozempic [semaglutide] Diarrhea   Erythromycin Nausea And Vomiting   Lisinopril Itching, Other (See Comments), Cough   Reaction:  Throat itching       Medication List        Accurate as of 10/10/17  4:09 PM. Always use your most recent med list.          canagliflozin 100 MG Tabs tablet Commonly known as:  INVOKANA 1 tablet before breakfast   clobetasol ointment 0.05 % Commonly known as:  TEMOVATE Apply 1 application topically daily as needed (for itching).   gabapentin 300 MG capsule Commonly known as:  NEURONTIN Take 1 capsule (300 mg total) by mouth 3 (three) times daily.   Insulin Pen Needle  31G X 5 MM Misc Use with insulin pen 3 times a day   insulin regular human CONCENTRATED 500 UNIT/ML kwikpen Commonly known as:  HUMULIN R U-500 KWIKPEN 100 Units, 30 minutes before each meal or as directed   losartan 50 MG tablet Commonly known as:  COZAAR Take 50 mg by mouth daily.   Metformin HCl 500 MG/5ML Soln 5ml with each meal   simvastatin 40 MG tablet Commonly known as:  ZOCOR Take 40 mg by mouth at bedtime.       Allergies:  Allergies  Allergen Reactions  . Ozempic [Semaglutide] Diarrhea  . Erythromycin Nausea And Vomiting  . Lisinopril Itching, Other (See Comments) and Cough    Reaction:  Throat itching     Past Medical History:  Diagnosis Date  . Diabetes mellitus without complication (HCC)   . Hyperlipidemia   . Hypertension   . Neuromuscular disorder (HCC)   . Neuropathy     Past Surgical History:  Procedure Laterality Date  . TUBAL LIGATION  2003    Family History  Problem Relation Age of Onset  . Hypertension Mother   . Heart disease Mother   . Hypertension Father   . Hypertension Sister   . Hypertension Sister   . Diabetes Maternal Grandfather   . Diabetes Paternal Grandfather     Social History:  reports that she has never smoked. She has never used smokeless tobacco. She reports that she does not drink alcohol or use drugs.   Review of Systems   Lipid history: On simvastatin for several years  Last lipid panel        Lab Results  Component Value Date   CHOL 169 09/30/2017   HDL 57.70 09/30/2017   LDLCALC 95 09/30/2017   TRIG 81.0 09/30/2017   CHOLHDL 3 09/30/2017           Hypertension: On treatment for the last couple of years, currently on losartan 50 mg  Most recent eye exam was 2017  Most recent foot exam: 1/18  Complications of diabetes: Has symptoms of neuropathy, still having some burning and numbness some relief on gabapentin Recently having retinopathy with bleeding complication and going to see a  ophthalmologist   Physical Examination:  BP (!) 142/88 (BP Location: Left Arm, Patient Position: Sitting, Cuff Size: Normal)   Pulse 88   Ht 5' 2.75" (1.594 m)   Wt 171 lb 9.6 oz (77.8 kg)  LMP  (LMP Unknown)   SpO2 98%   BMI 30.64 kg/m      ASSESSMENT:  Diabetes type 2, insulin resistant, BMI 30     See history of present illness for detailed discussion of current diabetes management, blood sugar patterns and problems identified  A1c is recently over 13%  Again discussed that with insulin alone will be difficult to get her blood sugars under control because of her significant insulin resistance She reports blood sugars as high as 500 again Blood sugars are also higher postprandially even though not clear if she is watching her diet consistently   PLAN:    She will try to get Invokana again from the pharmacy and will let us know if she has difficulties with insurance coverage.  Currently this is appearing to be covered by her insurance and will need to do a PA if there is a step therapy involved  She is requesting to try metformin again although unlikely that this will help her significantly and she may not be able to tolerate the full dose because of previous history of nausea.  Since she prefers liquid metformin will prescribe 500 mg liquid metformin to be taken with her main meal to start with and then increase the dose up to a maximum of 1500 mg a day in divided doses  She will need to significantly increase her insulin with taking 150 units when she wakes up, 100 units at her lunch at work and 100 units in the morning and may need to increase further  However if she has low readings with a starting Invokana and metformin she may need to reduce her dose  Consistent exercise regimen  Consider consultation with dietitian  She needs to find out what brand of glucose monitor is covered by her insurance and will need to review her blood sugars on the next  visit     Patient Instructions  Insulin 100 unit in am , 150 in afternoon and 100 units at nite  Check blood sugars on waking up  3/7   Also check blood sugars about 2 hours after a meal and do this after different meals by rotation  Recommended blood sugar levels on waking up is 90-130 and about 2 hours after meal is 130-160  Please bring your blood sugar monitor to each visit, thank you  Find out what meter is covered  Start taking Metformin 500 mg, 1 tsp  with your main meal for 5 days. Occasionally this may initially cause loose stools or nausea. If  tolerating well after 5 days add a second Metformin tablet (500 mg) at the same time. Continue adding another tablet after 5 days days if no persistent nausea or diarrhea until reaching the maximum tolerated dose        Reather Littler 10/10/2017, 4:09 PM   Note: This office note was prepared with Dragon voice recognition system technology. Any transcriptional errors that result from this process are unintentional.

## 2017-10-13 ENCOUNTER — Telehealth: Payer: Self-pay | Admitting: *Deleted

## 2017-10-13 NOTE — Telephone Encounter (Signed)
Metformin 500mg /ml PA initiated via CoverMyMeds. Key: [id number]

## 2017-10-21 ENCOUNTER — Other Ambulatory Visit: Payer: Self-pay | Admitting: Endocrinology

## 2017-10-21 NOTE — Telephone Encounter (Signed)
Metformin 500 mg/ml PA is denied because patient is currently taking tablet or capsule formulations of other medications.

## 2017-10-21 NOTE — Telephone Encounter (Signed)
State intolerance to metformin capsules or tablets, unable to swallow

## 2017-10-26 ENCOUNTER — Telehealth: Payer: Self-pay

## 2017-10-26 NOTE — Telephone Encounter (Signed)
New PA intiated.

## 2017-10-26 NOTE — Telephone Encounter (Signed)
Additional PA initiated via CoverMyMeds for Metformin /16mL solution.

## 2017-11-01 DIAGNOSIS — H25013 Cortical age-related cataract, bilateral: Secondary | ICD-10-CM | POA: Diagnosis not present

## 2017-11-01 DIAGNOSIS — H524 Presbyopia: Secondary | ICD-10-CM | POA: Diagnosis not present

## 2017-11-02 ENCOUNTER — Telehealth: Payer: Self-pay

## 2017-11-02 NOTE — Telephone Encounter (Signed)
Received fax from [facility] of Tammy Chan stating that patient has been denied coverage for Metformin HCL /74ml oral solution. Reference # is [id number] ID #: [id number]

## 2017-11-11 ENCOUNTER — Encounter: Payer: Self-pay | Admitting: Dietician

## 2017-11-11 ENCOUNTER — Encounter: Payer: BLUE CROSS/BLUE SHIELD | Attending: Endocrinology | Admitting: Dietician

## 2017-11-11 DIAGNOSIS — Z713 Dietary counseling and surveillance: Secondary | ICD-10-CM | POA: Diagnosis not present

## 2017-11-11 DIAGNOSIS — Z794 Long term (current) use of insulin: Secondary | ICD-10-CM | POA: Insufficient documentation

## 2017-11-11 DIAGNOSIS — Z6830 Body mass index (BMI) 30.0-30.9, adult: Secondary | ICD-10-CM | POA: Insufficient documentation

## 2017-11-11 DIAGNOSIS — E1165 Type 2 diabetes mellitus with hyperglycemia: Secondary | ICD-10-CM | POA: Diagnosis not present

## 2017-11-11 NOTE — Patient Instructions (Addendum)
Snack ideas:  Plain yogurt with fruit   Hummus with raw vegetables  Cheese and 5 crackers  Peanut butter and 5 crackers  Fruit and nuts or cheese  Check your blood sugar and write it in the book.  Orlean Bradford your book to your next appointment with Dr. Lucianne Muss. Be active most days.  Aim for 30 minutes of walking or light weights.  Start small and increase as tolerated. Remember to always take your insulin. Rotate your sites and avoid the hard spots and bruises. Start vitamin D:  4000 units vitamin D 3 daily. Drink more water.   Continue lean meats and reduce the portion size  Eat more meatless meals.   Increase your vegetable intake.

## 2017-11-13 NOTE — Progress Notes (Signed)
Diabetes Self-Management Education  Visit Type: First/Initial  Appt. Start Time: 1300 Appt. End Time: 1410  11/13/2017  [name], identified by name and date of birth, is a 52 y.o. female with a diagnosis of Diabetes: Type 2. Patient of Dr. Lucianne Muss.  Other history includes HTN, hyperlipidemia, poor vision secondary to needing cataract surgery. She also reports a vitamin D deficiency but has not been taking a supplement. She was diagnosed with diabetes in 1986. Medications include:  Humulin U-500 100 units at 6 am, 75 units at 2 pm, and 75 units at 10 pm.  She takes the insulin differently than prescribed as she fears a low blood sugar.  Timing is also different as due to her sleep schedule.  She is not taking the Invokana as she states that it is not covered by her insurance and is not taking Metformin as she has trouble swallowing large pills and the liquid is not covered by her insurance.  She is considering having Dr. Lucianne Muss give her a prescription for the regular Metformin.    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  She sleeps 9-13 hours per day.  She states that she is always tired.  She lives with her 40 yo daughter who helps her with cooking and her 70 yo son.    She has been using the blood sugar meter at work.  Provided a Contour Next meter today and told her to write down the readings and bring them with her to her next appointment.  (Lot [id number], Exp 05/27/18). ASSESSMENT  Height  (1.6 m), weight 174 lb (78.9 kg). Body mass index is 30.82 kg/m.  Diabetes Self-Management Education - 11/11/17 1350      Visit Information   Visit Type  First/Initial      Initial Visit   Diabetes Type  Type 2    Are you currently following a meal plan?  No    Are you taking your medications as prescribed?  No    Date Diagnosed  1986      Health Coping   How would you rate your overall health?  Good      Psychosocial Assessment   Patient Belief/Attitude about Diabetes   Defeat/Burnout    Self-care barriers  Impaired vision    Self-management support  Doctor's office;Family;CDE visits    Other persons present  Patient;Family Member    Patient Concerns  Nutrition/Meal planning;Glycemic Control;Weight Control    Special Needs  Large print;Verbal instruction    Learning Readiness  Ready    How often do you need to have someone help you when you read instructions, pamphlets, or other written materials from your doctor or pharmacy?  1 - Never    What is the last grade level you completed in school?  10 years college      Pre-Education Assessment   Patient understands the diabetes disease and treatment process.  Needs Review    Patient understands incorporating nutritional management into lifestyle.  Needs Review    Patient undertands incorporating physical activity into lifestyle.  Needs Review    Patient understands using medications safely.  Needs Review    Patient understands monitoring blood glucose, interpreting and using results  Needs Review    Patient understands prevention, detection, and treatment of acute complications.  Needs Review    Patient understands prevention, detection, and treatment of chronic complications.  Needs Review    Patient understands how to develop strategies to address psychosocial issues.  Needs Review    Patient understands how to develop strategies to promote health/change behavior.  Needs Review      Complications   Last HgB A1C per patient/outside source  13.2 % 08/30/17, 13.7% 03/04/17, 15.5% 07/15/16    How often do you check your blood sugar?  3-4 times/day    Postprandial Blood glucose range (mg/dL)  >161 above 096    Number of hypoglycemic episodes per month  0    Number of hyperglycemic episodes per week  21    Can you tell when your blood sugar is high?  Yes    What do you do if your blood sugar is high?  drinks water    Have you had a dilated eye exam in the past 12 months?  Yes    Have you had a dental exam in the  past 12 months?  No    Are you checking your feet?  Yes    How many days per week are you checking your feet?  1      Dietary Intake   Breakfast  juice, scrambled eggs, zuchini, tomatoes, 1 slice honey wheat toast, occasional fruit 9 am    Snack (morning)  none    Lunch  leftovers from dinner 12:30-1 am    Snack (afternoon)  none    Dinner  baked chicken or fish or shrimp or vegetarian burger or corned bean, pasta or corn or beans or rice AND vegetables often but not always 3:30-6 pm    Snack (evening)  crackers, cheese, dip, nuts, cranberries, occasionall fresh or canned fruit, yogurt    Beverage(s)  carbonated water, water, juice      Exercise   Exercise Type  ADL's      Patient Education   Previous Diabetes Education  Yes (please comment) 2013    Disease state   Other (comment) Reviewed insulin resistance    Nutrition management   Role of diet in the treatment of diabetes and the relationship between the three main macronutrients and blood glucose level;Food label reading, portion sizes and measuring food.;Information on hints to eating out and maintain blood glucose control.;Meal timing in regards to the patients' current diabetes medication.    Physical activity and exercise   Role of exercise on diabetes management, blood pressure control and cardiac health.    Medications  Reviewed patients medication for diabetes, action, purpose, timing of dose and side effects.    Monitoring  Purpose and frequency of SMBG.;Identified appropriate SMBG and/or A1C goals.;Daily foot exams;Yearly dilated eye exam    Acute complications  Taught treatment of hypoglycemia - the 15 rule.    Chronic complications  Relationship between chronic complications and blood glucose control    Psychosocial adjustment  Worked with patient to identify barriers to care and solutions      Individualized Goals (developed by patient)   Nutrition  General guidelines for healthy choices and portions discussed    Physical  Activity  Exercise 3-5 times per week;15 minutes per day    Medications  take my medication as prescribed    Monitoring   test my blood glucose as discussed    Reducing Risk  examine blood glucose patterns;treat hypoglycemia with 15 grams of carbs if blood glucose less than /dL    Health Coping  discuss diabetes with (comment) MD, RD, CDE      Post-Education Assessment   Patient understands the diabetes disease and treatment process.  Demonstrates understanding / competency  Patient understands incorporating nutritional management into lifestyle.  Needs Review    Patient undertands incorporating physical activity into lifestyle.  Demonstrates understanding / competency    Patient understands using medications safely.  Demonstrates understanding / competency    Patient understands monitoring blood glucose, interpreting and using results  Demonstrates understanding / competency    Patient understands prevention, detection, and treatment of acute complications.  Needs Review    Patient understands prevention, detection, and treatment of chronic complications.  Demonstrates understanding / competency    Patient understands how to develop strategies to address psychosocial issues.  Demonstrates understanding / competency    Patient understands how to develop strategies to promote health/change behavior.  Demonstrates understanding / competency      Outcomes   Expected Outcomes  Demonstrated interest in learning. Expect positive outcomes    Future DMSE  2 months    Program Status  Not Completed       Individualized Plan for Diabetes Self-Management Training:   Learning Objective:  Patient will have a greater understanding of diabetes self-management. Patient education plan is to attend individual and/or group sessions per assessed needs and concerns.   Plan:   Patient Instructions  Snack ideas:  Plain yogurt with fruit   Hummus with raw vegetables  Cheese and 5 crackers  Peanut  butter and 5 crackers  Fruit and nuts or cheese  Check your blood sugar and write it in the book.  Orlean Bradford your book to your next appointment with Dr. Lucianne Muss. Be active most days.  Aim for 30 minutes of walking or light weights.  Start small and increase as tolerated. Remember to always take your insulin. Rotate your sites and avoid the hard spots and bruises. Start vitamin D:  4000 units vitamin D 3 daily. Drink more water.   Continue lean meats and reduce the portion size  Eat more meatless meals.   Increase your vegetable intake.   Expected Outcomes:  Demonstrated interest in learning. Expect positive outcomes  Education material provided: Food label handouts, A1C conversion sheet, Meal plan card, My Plate and Snack sheet, ADA Diabetes Your Take Control Plan  If problems or questions, patient to contact team via:  Phone  Future DSME appointment: 2 months

## 2017-11-17 ENCOUNTER — Telehealth: Payer: Self-pay | Admitting: Endocrinology

## 2017-11-17 NOTE — Telephone Encounter (Signed)
Please advise the dosage and frequency for the pills you would like.

## 2017-11-17 NOTE — Telephone Encounter (Signed)
Please find out if she is able to taking metformin tablets again

## 2017-11-17 NOTE — Telephone Encounter (Signed)
[facility] on [address] called re: Metformin Solution-insurance does not pay for that med-Patient would like a replacement medication. Please send new RX to the above Pharmacy

## 2017-11-22 NOTE — Telephone Encounter (Signed)
Called and left detailed message with patient and asked to call back.

## 2017-11-23 ENCOUNTER — Telehealth: Payer: Self-pay | Admitting: Endocrinology

## 2017-11-23 NOTE — Telephone Encounter (Signed)
She can take metformin between 1-3 times a day with food as tolerated. We will still need to see her as scheduled since insulin adjustment is more important than metformin

## 2017-11-23 NOTE — Telephone Encounter (Signed)
Patient stated she was returning your call,

## 2017-11-23 NOTE — Telephone Encounter (Signed)
Called patient and left message for her to call back.

## 2017-11-23 NOTE — Telephone Encounter (Signed)
Patient states that she is going to attempt to take th Metformin tablets again because insurance will not pay for the oral solution form of the medication. Patient would like to know the dosage and directions of the Metformin and she would also like to know if she can push her appointment out for another 6 weeks. She is supposed to see you next week but it will not be long after resuming the Metformin.

## 2017-11-24 ENCOUNTER — Other Ambulatory Visit: Payer: Self-pay

## 2017-11-24 MED ORDER — METFORMIN HCL 500 MG PO TABS: ORAL_TABLET | ORAL | 3 refills | Status: DC

## 2017-11-24 NOTE — Telephone Encounter (Signed)
What dosage of Metformin?

## 2017-11-24 NOTE — Telephone Encounter (Signed)
Patient called and detailed message left for patient. New Rx sent to pharmacy listed in chart.

## 2017-11-24 NOTE — Telephone Encounter (Signed)
500mg

## 2017-11-28 ENCOUNTER — Other Ambulatory Visit: Payer: Self-pay

## 2017-11-30 ENCOUNTER — Other Ambulatory Visit: Payer: Self-pay

## 2017-12-02 ENCOUNTER — Ambulatory Visit: Payer: Self-pay | Admitting: Endocrinology

## 2017-12-09 DIAGNOSIS — L03811 Cellulitis of head [any part, except face]: Secondary | ICD-10-CM | POA: Diagnosis not present

## 2017-12-09 DIAGNOSIS — R600 Localized edema: Secondary | ICD-10-CM | POA: Diagnosis not present

## 2017-12-09 DIAGNOSIS — L729 Follicular cyst of the skin and subcutaneous tissue, unspecified: Secondary | ICD-10-CM | POA: Diagnosis not present

## 2017-12-13 DIAGNOSIS — E109 Type 1 diabetes mellitus without complications: Secondary | ICD-10-CM | POA: Diagnosis not present

## 2017-12-13 DIAGNOSIS — L0201 Cutaneous abscess of face: Secondary | ICD-10-CM | POA: Diagnosis not present

## 2017-12-24 ENCOUNTER — Other Ambulatory Visit: Payer: Self-pay | Admitting: Endocrinology

## 2018-01-02 ENCOUNTER — Other Ambulatory Visit: Payer: Self-pay

## 2018-01-02 MED ORDER — GLUCOSE BLOOD VI STRP: 1.0000 | ORAL_STRIP | 3 refills | Status: DC | PRN

## 2018-01-12 ENCOUNTER — Ambulatory Visit: Payer: Self-pay | Admitting: Dietician

## 2018-01-24 ENCOUNTER — Other Ambulatory Visit (INDEPENDENT_AMBULATORY_CARE_PROVIDER_SITE_OTHER): Payer: BLUE CROSS/BLUE SHIELD

## 2018-01-24 DIAGNOSIS — Z794 Long term (current) use of insulin: Secondary | ICD-10-CM

## 2018-01-24 DIAGNOSIS — E1165 Type 2 diabetes mellitus with hyperglycemia: Secondary | ICD-10-CM | POA: Diagnosis not present

## 2018-01-24 LAB — BASIC METABOLIC PANEL
BUN: 11 mg/dL (ref 6–23)
CO2: 27 mEq/L (ref 19–32)
Calcium: 9.5 mg/dL (ref 8.4–10.5)
Chloride: 100 mEq/L (ref 96–112)
Creatinine, Ser: 0.58 mg/dL (ref 0.40–1.20)
GFR: 114.9 mL/min (ref >60.00)
Glucose, Bld: 430 mg/dL — ABNORMAL HIGH (ref 70–99)
Potassium: 4 mEq/L (ref 3.5–5.1)
Sodium: 135 mEq/L (ref 135–145)

## 2018-01-24 LAB — HEMOGLOBIN A1C: Hgb A1c MFr Bld: 13.1 % — ABNORMAL HIGH (ref 4.6–6.5)

## 2018-01-27 ENCOUNTER — Ambulatory Visit: Payer: Self-pay | Admitting: Endocrinology

## 2018-01-27 DIAGNOSIS — Z0289 Encounter for other administrative examinations: Secondary | ICD-10-CM

## 2018-01-30 ENCOUNTER — Ambulatory Visit: Payer: Self-pay | Admitting: Dietician

## 2018-02-01 DIAGNOSIS — H25011 Cortical age-related cataract, right eye: Secondary | ICD-10-CM | POA: Diagnosis not present

## 2018-02-01 DIAGNOSIS — H25811 Combined forms of age-related cataract, right eye: Secondary | ICD-10-CM | POA: Diagnosis not present

## 2018-02-01 DIAGNOSIS — H2511 Age-related nuclear cataract, right eye: Secondary | ICD-10-CM | POA: Diagnosis not present

## 2018-02-07 ENCOUNTER — Encounter: Payer: Self-pay | Admitting: Dietician

## 2018-02-07 ENCOUNTER — Encounter: Payer: BLUE CROSS/BLUE SHIELD | Attending: Endocrinology | Admitting: Dietician

## 2018-02-07 DIAGNOSIS — E1165 Type 2 diabetes mellitus with hyperglycemia: Secondary | ICD-10-CM | POA: Diagnosis not present

## 2018-02-07 DIAGNOSIS — Z713 Dietary counseling and surveillance: Secondary | ICD-10-CM | POA: Insufficient documentation

## 2018-02-07 DIAGNOSIS — Z794 Long term (current) use of insulin: Secondary | ICD-10-CM | POA: Insufficient documentation

## 2018-02-07 DIAGNOSIS — Z6830 Body mass index (BMI) 30.0-30.9, adult: Secondary | ICD-10-CM | POA: Insufficient documentation

## 2018-02-07 NOTE — Patient Instructions (Signed)
Continue to walk.  Aim for 30 minutes most days. Avoid regular soda and other drinks with carbohydrates. Watch your snacking at night.  Avoid eating out of stress.  Snacks should be protein (nuts, 1 ounce cheese with fruit or 5 crackers OR hummus and raw vegetables.)  Take 4,000 units Vitamin D daily.

## 2018-02-07 NOTE — Progress Notes (Signed)
Diabetes Self-Management Education  Visit Type:  Follow-up  Appt. Start Time: 1630 Appt. End Time: 1700  02/09/2018  Tammy Chan HumanJacinth Matarazzo, identified by name and date of birth, is a 52 y.o. female with a diagnosis of Diabetes: Type 2.  Patient of Dr. Lucianne MussKumar.  Other history includes HTN, HLD.  Vision improved post cataract surgery.  Vitamin D deficiency history per patient.  She does not get this from sun or diet and is not taking a supplement.  Medication:  Invokana- not taking as it is not covered  Metformin- stopped due to severe diarrhea 3 days ago.  Extended release Metformin is not covered by insurance.  Humulin R U-500- 100 units each am, 75 units 3-5 pm with lunch, 70 units Midnight with dinner while at work  Patient is here today with her daughter.  She recently had cataract surgery.  172 lbs today and she is very concerned about further weight gain.  She reports UBW 156 lbs in April.  She was given a Contour Next Meter last visit and reports using this. Post meal and fasting blood sugar remain greater than 200.  She states that she still drinks occasional regular soda and juice. A1C 13.1 01/24/18 basically unchanged from March 13.2%.  She snacks all night when she works.  Portions and choices during meals may also be high. 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  Her 54 yo daughter who is here today helps her with cooking.  She also has a 54 yo son.   ASSESSMENT  Weight 172 lb (78 kg). Body mass index is 30.47 kg/m.   Diabetes Self-Management Education - 02/09/18 1300      Health Coping   How would you rate your overall health?  Good      Psychosocial Assessment   Patient Belief/Attitude about Diabetes  Defeat/Burnout    Self-care barriers  Impaired vision   although improved after cateract surgery   Self-management support  Doctor's office;Family    Patient Concerns  Nutrition/Meal planning;Problem Solving;Glycemic Control;Weight Control    Special  Needs  Verbal instruction    Preferred Learning Style  No preference indicated    Learning Readiness  Ready      Subsequent Visit   Since your last visit have you continued or begun to take your medications as prescribed?  Yes    Since your last visit have you experienced any weight changes?  No change        Learning Objective:  Patient will have a greater understanding of diabetes self-management. Patient education plan is to attend individual and/or group sessions per assessed needs and concerns.   Plan:   Patient Instructions  Continue to walk.  Aim for 30 minutes most days. Avoid regular soda and other drinks with carbohydrates. Watch your snacking at night.  Avoid eating out of stress.  Snacks should be protein (nuts, 1 ounce cheese with fruit or 5 crackers OR hummus and raw vegetables.)  Take 4,000 units Vitamin D daily.    Expected Outcomes:  Demonstrated interest in learning. Expect positive outcomesPatient interested in learning but difficult making changes  Education material provided:   If problems or questions, patient to contact team via:  Phone  Future DSME appointment: - 2 months2 months

## 2018-03-22 ENCOUNTER — Ambulatory Visit: Payer: BLUE CROSS/BLUE SHIELD | Admitting: Endocrinology

## 2018-03-22 ENCOUNTER — Encounter: Payer: Self-pay | Admitting: Endocrinology

## 2018-03-22 VITALS — BP 138/88 | HR 56 | Ht 63.0 in | Wt 175.2 lb

## 2018-03-22 DIAGNOSIS — Z794 Long term (current) use of insulin: Secondary | ICD-10-CM

## 2018-03-22 DIAGNOSIS — E1165 Type 2 diabetes mellitus with hyperglycemia: Secondary | ICD-10-CM | POA: Diagnosis not present

## 2018-03-22 DIAGNOSIS — I10 Essential (primary) hypertension: Secondary | ICD-10-CM

## 2018-03-22 LAB — POCT GLYCOSYLATED HEMOGLOBIN (HGB A1C): Hemoglobin A1C: 11.6 % — AB (ref 4.0–5.6)

## 2018-03-22 MED ORDER — DAPAGLIFLOZIN PROPANEDIOL 5 MG PO TABS: 5.0000 mg | ORAL_TABLET | Freq: Every day | ORAL | 2 refills | Status: DC

## 2018-03-22 NOTE — Progress Notes (Signed)
Patient ID: Tammy Chan, female   DOB: 04-23-1966, 52 y.o.   MRN: [id number]          Reason for Appointment:  follow-up  for Type 2 Diabetes   History of Present Illness:          Date of diagnosis of type 2 diabetes mellitus: 1986       Background history:   She thinks she was started on insulin at time of diagnosis, this was at the age of about 30 She also had tried some oral medications which apparently did not work including metformin Over the years she has taken various insulin regimens including premixed insulin, Lantus and NovoLog Detailed records are not available and not clear what her level of control has been A1c in 2014 and 2015 at [facility] was around 14  Recent history:   INSULIN regimen is: Humulin R U-500 insulin  100 units Ac 3x a day  A1c is 11.1 compared to 13.1 previously  She works night shifts Insulin timing: 8-10 am, 4-6 p-m, 11-12 pm  Non-insulin hypoglycemic drugs the patient is taking are: None  Current management, blood sugar patterns and problems identified:  She has started using the contour meter and finally has brought her monitor for download  However has checked her blood sugars only on 4 days in the last 30 days  Does not have any consistent pattern of her blood sugars at checking either in the mornings, late afternoon and some in the evening  She says that sometimes when she comes back from work in the morning she is too tired and will forget her insulin which made make her sugar higher in the afternoon  Insulin dose was increased on the last visit, previously taking 70 units in the morning and evening  He still has difficulty losing weight and this has gone up slightly  Apart from activity at work she does not do any exercise  She may occasionally have high readings from drinking regular soft drinks  Also not clear from her monitor which readings are after meals and which are before  She has been recommended Invokana but she  thinks that her insurance does not cover it and she is not able to get it from her drugstore       Side effects from medications have been: Diarrhea from Ozempic, nausea from metformin, has difficulty swallowing large tablets like metformin  Mealtimes:  lunch around 4 PM and dinner at 11PM Shots 6am, 3 pm and 11pm   Compliance with the medical regimen: Fair Hypoglycemia:  None  Glucose monitoring:  done 0-1 times a day         Glucometer:  Contour   PRE-MEAL  afternoon Lunch Dinner Bedtime Overall  Glucose range:  124-342   257, 118  90-311   Mean/median:      215   POST-MEAL PC Breakfast PC Lunch PC Dinner  Glucose range:   ?  Mean/median:        Self-care:  Meal times are:  Breakfast is 9 am Lunch: 11 pm Dinner: 6 pm  Typical meal intake: Breakfast is eggs, toast or cereal, Lunch Is a sandwich, evening meal is  chicken and some rice    Dietician visit, most recent: 8/19  CDE consultation: 2/18                Exercise:  walking a little some times  Weight history:  Wt Readings from Last 3 Encounters:  03/22/18 175 lb  3.2 oz (79.5 kg)  02/07/18 172 lb (78 kg)  11/11/17 174 lb (78.9 kg)    Glycemic control: A1c in 1/18 was 15.5    Lab Results  Component Value Date   HGBA1C 11.6 (A) 03/22/2018   HGBA1C 13.1 (H) 01/24/2018   HGBA1C 13.2 (H) 08/30/2017   Lab Results  Component Value Date   MICROALBUR 1.0 08/30/2017   LDLCALC 95 09/30/2017   CREATININE 0.58 01/24/2018   Lab Results  Component Value Date   MICRALBCREAT 1.2 08/30/2017   Lab Results  Component Value Date   FRUCTOSAMINE 418 (H) 09/30/2017   FRUCTOSAMINE 323 (H) 09/27/2016       Allergies as of 03/22/2018      Reactions   Ozempic [semaglutide] Diarrhea   Erythromycin Nausea And Vomiting   Lisinopril Itching, Other (See Comments), Cough   Reaction:  Throat itching       Medication List        Accurate as of 03/22/18 11:59 PM. Always use your most recent med list.            canagliflozin 100 MG Tabs tablet Commonly known as:  INVOKANA 1 tablet before breakfast   clobetasol ointment 0.05 % Commonly known as:  TEMOVATE Apply 1 application topically daily as needed (for itching).   dapagliflozin propanediol 5 MG Tabs tablet Commonly known as:  FARXIGA Take 5 mg by mouth daily.   gabapentin 300 MG capsule Commonly known as:  NEURONTIN Take 1 capsule (300 mg total) by mouth 3 (three) times daily.   glucose blood test strip 1 each by Other route as needed for other. Use as instructed to check blood sugar 1 time daily   HUMULIN R U-500 KWIKPEN 500 UNIT/ML kwikpen Generic drug:  insulin regular human CONCENTRATED 100 UNITS, 30 MINUTES BEFORE EACH MEAL OR AS DIRECTED   Insulin Pen Needle 31G X 5 MM Misc USE WITH INSULIN PEN 3 TIMES A DAY   losartan 50 MG tablet Commonly known as:  COZAAR Take 50 mg by mouth daily.   PROBIOTIC ACIDOPHILUS PO Take by mouth.   simvastatin 40 MG tablet Commonly known as:  ZOCOR Take 40 mg by mouth at bedtime.       Allergies:  Allergies  Allergen Reactions  . Ozempic [Semaglutide] Diarrhea  . Erythromycin Nausea And Vomiting  . Lisinopril Itching, Other (See Comments) and Cough    Reaction:  Throat itching     Past Medical History:  Diagnosis Date  . Diabetes mellitus without complication (HCC)   . Hyperlipidemia   . Hypertension   . Neuromuscular disorder (HCC)   . Neuropathy     Past Surgical History:  Procedure Laterality Date  . TUBAL LIGATION  2003    Family History  Problem Relation Age of Onset  . Hypertension Mother   . Heart disease Mother   . Hypertension Father   . Hypertension Sister   . Hypertension Sister   . Diabetes Maternal Grandfather   . Diabetes Paternal Grandfather     Social History:  reports that she has never smoked. She has never used smokeless tobacco. She reports that she does not drink alcohol or use drugs.   Review of Systems   Lipid history: On  simvastatin for several years  Last lipid panel        Lab Results  Component Value Date   CHOL 169 09/30/2017   HDL 57.70 09/30/2017   LDLCALC 95 09/30/2017   TRIG 81.0 09/30/2017  CHOLHDL 3 09/30/2017           Hypertension: Treated by PCP, currently on losartan 50 mg  Most recent eye exam was 2017  Most recent foot exam: 1/18  Complications of diabetes: Has symptoms of neuropathy, still having some burning and numbness , relief on gabapentin hs Has proliferative retinopathy   Physical Examination:  BP 138/88 (BP Location: Left Arm, Patient Position: Sitting, Cuff Size: Normal)   Pulse (!) 56   Ht 5\' 3"  (1.6 m)   Wt 175 lb 3.2 oz (79.5 kg)   LMP  (LMP Unknown)   SpO2 96%   BMI 31.04 kg/m      ASSESSMENT:  Diabetes type 2, insulin resistant, BMI 31  See history of present illness for detailed discussion of current diabetes management, blood sugar patterns and problems identified  A1c is improving around 11%, previously mostly around 13%  She is still taking large amounts of insulin as monotherapy since she cannot tolerate metformin Her insurance appears to be not covering any SGLT2 drugs Invokana was prescribed previously Blood sugars are inconsistent with some high and low readings at all times and this may be related to her inconsistent schedule, not always taking her insulin as prescribed and variable diet  HYPERTENSION: Blood pressure is relatively high with current regimen of losartan   PLAN:    She will try to get an SGLT2 drug and may try [place] with a co-pay card Discussed action of SGLT 2 drugs on lowering glucose by decreasing kidney absorption of glucose, benefits of weight loss and lower blood pressure, possible side effects including candidiasis and dosage regimen   Since her blood pressure is relatively higher today will not reduce her medication while starting [place]  She needs to start walking regularly  To check blood sugars  consistently once daily and rotate the times of monitoring including some after meals  Discussed blood sugar targets at various times  She will need to make sure she takes her Humulin R consistently 3 times a day  If she is not going to eat in the morning when coming back from work she can at least take half of the Humulin dosage  Reduce regular soft drinks     There are no Patient Instructions on file for this visit.  Counseling time on subjects discussed in assessment and plan sections is over 50% of today's 25 minute visit   Reather Littler 03/23/2018, 9:11 AM   Note: This office note was prepared with Dragon voice recognition system technology. Any transcriptional errors that result from this process are unintentional.

## 2018-04-17 DIAGNOSIS — R05 Cough: Secondary | ICD-10-CM | POA: Diagnosis not present

## 2018-04-18 ENCOUNTER — Ambulatory Visit: Payer: Self-pay | Admitting: Dietician

## 2018-05-08 DIAGNOSIS — E113511 Type 2 diabetes mellitus with proliferative diabetic retinopathy with macular edema, right eye: Secondary | ICD-10-CM | POA: Diagnosis not present

## 2018-05-08 DIAGNOSIS — H3582 Retinal ischemia: Secondary | ICD-10-CM | POA: Diagnosis not present

## 2018-05-08 DIAGNOSIS — E113412 Type 2 diabetes mellitus with severe nonproliferative diabetic retinopathy with macular edema, left eye: Secondary | ICD-10-CM | POA: Diagnosis not present

## 2018-05-08 DIAGNOSIS — H43822 Vitreomacular adhesion, left eye: Secondary | ICD-10-CM | POA: Diagnosis not present

## 2018-05-19 ENCOUNTER — Other Ambulatory Visit: Payer: Self-pay

## 2018-05-22 ENCOUNTER — Ambulatory Visit: Payer: Self-pay | Admitting: Endocrinology

## 2018-05-29 DIAGNOSIS — Z124 Encounter for screening for malignant neoplasm of cervix: Secondary | ICD-10-CM | POA: Diagnosis not present

## 2018-05-29 DIAGNOSIS — I10 Essential (primary) hypertension: Secondary | ICD-10-CM | POA: Diagnosis not present

## 2018-05-29 DIAGNOSIS — E785 Hyperlipidemia, unspecified: Secondary | ICD-10-CM | POA: Diagnosis not present

## 2018-05-29 DIAGNOSIS — Z0000 Encounter for general adult medical examination without abnormal findings: Secondary | ICD-10-CM | POA: Diagnosis not present

## 2018-05-30 ENCOUNTER — Other Ambulatory Visit: Payer: Self-pay | Admitting: Family Medicine

## 2018-05-30 ENCOUNTER — Other Ambulatory Visit (HOSPITAL_COMMUNITY)
Admission: RE | Admit: 2018-05-30 | Discharge: 2018-05-30 | Disposition: A | Discharge status: Home / Self Care | Payer: BLUE CROSS/BLUE SHIELD | Source: Ambulatory Visit | Attending: Family Medicine | Admitting: Family Medicine

## 2018-05-30 DIAGNOSIS — Z01411 Encounter for gynecological examination (general) (routine) with abnormal findings: Secondary | ICD-10-CM | POA: Diagnosis not present

## 2018-06-01 LAB — CYTOLOGY - PAP
Adequacy: ABSENT
Diagnosis: NEGATIVE
HPV: NOT DETECTED

## 2018-06-08 DIAGNOSIS — H2512 Age-related nuclear cataract, left eye: Secondary | ICD-10-CM | POA: Diagnosis not present

## 2018-06-12 DIAGNOSIS — E113412 Type 2 diabetes mellitus with severe nonproliferative diabetic retinopathy with macular edema, left eye: Secondary | ICD-10-CM | POA: Diagnosis not present

## 2018-06-12 DIAGNOSIS — H3582 Retinal ischemia: Secondary | ICD-10-CM | POA: Diagnosis not present

## 2018-06-12 DIAGNOSIS — H43822 Vitreomacular adhesion, left eye: Secondary | ICD-10-CM | POA: Diagnosis not present

## 2018-06-12 DIAGNOSIS — E113511 Type 2 diabetes mellitus with proliferative diabetic retinopathy with macular edema, right eye: Secondary | ICD-10-CM | POA: Diagnosis not present

## 2018-06-13 ENCOUNTER — Other Ambulatory Visit: Payer: Self-pay

## 2018-06-13 ENCOUNTER — Telehealth: Payer: Self-pay | Admitting: Endocrinology

## 2018-06-13 MED ORDER — INSULIN REGULAR HUMAN (CONC) 500 UNIT/ML ~~LOC~~ SOPN: PEN_INJECTOR | SUBCUTANEOUS | 4 refills | Status: DC

## 2018-06-13 NOTE — Telephone Encounter (Signed)
MEDICATION: HUMULIN R U-500 KWIKPEN 500 UNIT/ML kwikpen  PH[facility]ACY:  [facility] [address]  IS THIS A 90 DAY SUPPLY : no  IS PFloridaIENT OUT OF MEDICATION: yes  IF NOT; HOW MUCH IS LEFT: none since last week  LAST APPOINTMENT DATE: @12 /17/2019  NEXT APPOINTMENT DATE:@12 /23/2019  DO WE HAVE YOUR PERMISSION TO LEAVE A DETAILED MESSAGE:yes  OTHER COMMENTS:    **Let patient know to contact pharmacy at the end of the day to make sure medication is ready. **  ** Please notify patient to allow 48-72 hours to process**  **Encourage patient to contact the pharmacy for refills or they can request refills through [facility]**

## 2018-06-13 NOTE — Telephone Encounter (Signed)
error 

## 2018-06-13 NOTE — Telephone Encounter (Signed)
This has been done.

## 2018-06-19 ENCOUNTER — Other Ambulatory Visit (INDEPENDENT_AMBULATORY_CARE_PROVIDER_SITE_OTHER): Payer: BLUE CROSS/BLUE SHIELD

## 2018-06-19 DIAGNOSIS — E1165 Type 2 diabetes mellitus with hyperglycemia: Secondary | ICD-10-CM

## 2018-06-19 DIAGNOSIS — Z794 Long term (current) use of insulin: Secondary | ICD-10-CM | POA: Diagnosis not present

## 2018-06-19 LAB — BASIC METABOLIC PANEL
BUN: 11 mg/dL (ref 6–23)
CO2: 28 mEq/L (ref 19–32)
Calcium: 9.4 mg/dL (ref 8.4–10.5)
Chloride: 102 mEq/L (ref 96–112)
Creatinine, Ser: 0.58 mg/dL (ref 0.40–1.20)
GFR: 114.73 mL/min (ref >60.00)
Glucose, Bld: 360 mg/dL — ABNORMAL HIGH (ref 70–99)
Potassium: 4.3 mEq/L (ref 3.5–5.1)
Sodium: 139 mEq/L (ref 135–145)

## 2018-06-20 LAB — FRUCTOSAMINE: Fructosamine: 455 umol/L — ABNORMAL HIGH (ref 0–285)

## 2018-06-26 NOTE — Progress Notes (Signed)
Patient ID: Tammy Chan, female   DOB: 08/30/1965, 52 y.o.   MRN: [id number]          Reason for Appointment:  follow-up  for Type 2 Diabetes   History of Present Illness:          Date of diagnosis of type 2 diabetes mellitus: 1986       Background history:   She thinks she was started on insulin at time of diagnosis, this was at the age of about [age] She also had tried some oral medications which apparently did not work including metformin Over the years she has taken various insulin regimens including premixed insulin, Lantus and NovoLog Detailed records are not available and not clear what her level of control has been A1c in 2014 and 2015 at [facility] was around 14  Recent history:   INSULIN regimen is: Humulin R U-500 insulin: 70 units at bedtime and 100 units lunch and dinner  A1c is 12 compared to 11.6 previously  She works night shifts Insulin timing: 8-10 am, 4-6 p-m, 11-12 pm  Non-insulin hypoglycemic drugs the patient is taking are: Metformin 500 mg twice daily  Current management, blood sugar patterns and problems identified:  In 9/19 on her last visit he was supposed to start [place] using the co-pay card but apparently she could not get this as directed and is only taking metformin on her own  However with metformin she is getting significant diarrhea at times  Also previously was told to take at least 100 units of insulin 3 times a day which he takes only 70 units at bedtime when she went back from work she did not eat a meal at that time  However FASTING blood sugars when she wakes up are usually around 200  Also reportedly her blood sugars at other times are over 200  Not checking readings after work and lab glucose was 360 when she came in in the morning hours  As before she is usually trying to avoid fried foods but not able to lose any weight  No formal exercise  Not clear how often she monitors her blood sugar as she did not bring her meter  today       Side effects from medications have been: Diarrhea from Ozempic, nausea from metformin, difficulty swallowing large tablets like metformin  Mealtimes:  lunch around 4 PM and dinner at 11PM  Compliance with the medical regimen: Fair Hypoglycemia:  None  Glucose monitoring:  done 0-1 times a day         Glucometer:  Contour  Blood sugar by recall:  PRE-MEAL  afternoon Lunch Dinner Bedtime Overall  Glucose range:  200 250    ?   Mean/median:         POST-MEAL PC Breakfast PC Lunch PC Dinner  Glucose range:   ?  Mean/median:        Self-care:  Meal times are:  Breakfast is 9 am Lunch: 11 pm Dinner: 6 pm  Typical meal intake: Breakfast is eggs, toast or cereal, Lunch Is a sandwich, evening meal is  chicken and some rice    Dietician visit, most recent: 8/19  CDE consultation: 2/18                Exercise:  walking a little some times  Weight history:  Wt Readings from Last 3 Encounters:  06/27/18 175 lb 6.4 oz (79.6 kg)  03/22/18 175 lb 3.2 oz (79.5 kg)  02/07/18 172  lb (78 kg)    Glycemic control: A1c in 1/18 was 15.5    Lab Results  Component Value Date   HGBA1C 12.0 (A) 06/27/2018   HGBA1C 11.6 (A) 03/22/2018   HGBA1C 13.1 (H) 01/24/2018   Lab Results  Component Value Date   MICROALBUR 1.0 08/30/2017   LDLCALC 95 09/30/2017   CREATININE 0.58 06/19/2018   Lab Results  Component Value Date   MICRALBCREAT 1.2 08/30/2017   Lab Results  Component Value Date   FRUCTOSAMINE 455 (H) 06/19/2018   FRUCTOSAMINE 418 (H) 09/30/2017   FRUCTOSAMINE 323 (H) 09/27/2016       Allergies as of 06/27/2018      Reactions   Ozempic [semaglutide] Diarrhea   Erythromycin Nausea And Vomiting   Lisinopril Itching, Other (See Comments), Cough   Reaction:  Throat itching       Medication List       Accurate as of June 27, 2018  9:47 AM. Always use your most recent med list.        dapagliflozin propanediol 5 MG Tabs tablet Commonly known as:   FARXIGA Take 5 mg by mouth daily.   gabapentin 300 MG capsule Commonly known as:  NEURONTIN Take 1 capsule (300 mg total) by mouth 3 (three) times daily.   glucose blood test strip Commonly known as:  CONTOUR NEXT TEST 1 each by Other route as needed for other. Use as instructed to check blood sugar 1 time daily   Insulin Pen Needle 31G X 5 MM Misc Commonly known as:  B-D UF III MINI PEN NEEDLES USE WITH INSULIN PEN 3 TIMES A DAY   insulin regular human CONCENTRATED 500 UNIT/ML kwikpen Commonly known as:  HUMULIN R U-500 KWIKPEN 100 UNITS, 30 MINUTES BEFORE EACH MEAL OR AS DIRECTED   losartan 50 MG tablet Commonly known as:  COZAAR Take 50 mg by mouth daily.   metFORMIN 500 MG tablet Commonly known as:  GLUCOPHAGE Take by mouth 2 (two) times daily with a meal. TAKE 1 TABLET BY MOUTH 3 TIMES DAILY.   PROBIOTIC ACIDOPHILUS PO Take by mouth.   simvastatin 40 MG tablet Commonly known as:  ZOCOR Take 40 mg by mouth at bedtime.       Allergies:  Allergies  Allergen Reactions  . Ozempic [Semaglutide] Diarrhea  . Erythromycin Nausea And Vomiting  . Lisinopril Itching, Other (See Comments) and Cough    Reaction:  Throat itching     Past Medical History:  Diagnosis Date  . Diabetes mellitus without complication (HCC)   . Hyperlipidemia   . Hypertension   . Neuromuscular disorder (HCC)   . Neuropathy     Past Surgical History:  Procedure Laterality Date  . TUBAL LIGATION  2003    Family History  Problem Relation Age of Onset  . Hypertension Mother   . Heart disease Mother   . Hypertension Father   . Hypertension Sister   . Hypertension Sister   . Diabetes Maternal Grandfather   . Diabetes Paternal Grandfather     Social History:  reports that she has never smoked. She has never used smokeless tobacco. She reports that she does not drink alcohol or use drugs.   Review of Systems   Lipid history: On simvastatin 40 mg for several years from her PCP Last  lipid panel        Lab Results  Component Value Date   CHOL 169 09/30/2017   HDL 57.70 09/30/2017   LDLCALC 95 09/30/2017  TRIG 81.0 09/30/2017   CHOLHDL 3 09/30/2017           Hypertension: Treated by PCP, currently on losartan 50 mg  Most recent eye exam was 8/19  Most recent foot exam: 1/18  Complications of diabetes: Has symptoms of neuropathy, having some burning and numbness , relief on gabapentin hs Has proliferative retinopathy   Physical Examination:  BP 138/90 (BP Location: Left Arm, Patient Position: Sitting, Cuff Size: Normal)   Pulse 96   Ht 5\' 3"  (1.6 m)   Wt 175 lb 6.4 oz (79.6 kg)   LMP  (LMP Unknown)   SpO2 96%   BMI 31.07 kg/m      ASSESSMENT:  Diabetes type 2, insulin resistant with complications, BMI 31  See history of present illness for detailed discussion of current diabetes management, blood sugar patterns and problems identified  A1c is still unacceptably high at 12%  Discussed that she is not getting enough insulin because of persistent hyperglycemia Difficult to assess her control because of lack of adequate monitoring and she did not bring her meter Explained to her that her readings appear to be progressively higher from waking up till bedtime with glucose 360 in the lab She cannot tolerate metformin and likely that even extended release would be tolerated much better she cannot tolerate a higher dose anyway Not clear why all SGLT2 drugs are not covered by her insurance  HYPERTENSION: Blood pressure is relatively high with current regimen of losartan and needs to follow-up with PCP Also may benefit from adding [place]iga   PLAN:   Given co-pay card and written prescription for Farxiga 5 mg daily  May need to do prior authorization for this she will call if not able to get it covered  Check blood sugars at least twice a day consistently  Increase insulin by 20 units at each time  Follow diet as given by dietitian  previously  She will need to bring her monitor on the next visit     Patient Instructions  Insulin 90 in am and 120 at other times  Check blood sugars on waking up days a week  Also check blood sugars about 2 hours after meals and do this after different meals by rotation  Recommended blood sugar levels on waking up are 90-130 and about 2 hours after meal is 130-160  Please bring your blood sugar monitor to each visit, thank you        Reather LittlerAjay Maleeka Sabatino 06/27/2018, 9:47 AM   Note: This office note was prepared with Dragon voice recognition system technology. Any transcriptional errors that result from this process are unintentional.

## 2018-06-27 ENCOUNTER — Other Ambulatory Visit: Payer: Self-pay

## 2018-06-27 ENCOUNTER — Ambulatory Visit: Payer: BLUE CROSS/BLUE SHIELD | Admitting: Endocrinology

## 2018-06-27 ENCOUNTER — Encounter: Payer: Self-pay | Admitting: Endocrinology

## 2018-06-27 VITALS — BP 138/90 | HR 96 | Ht 63.0 in | Wt 175.4 lb

## 2018-06-27 DIAGNOSIS — E1165 Type 2 diabetes mellitus with hyperglycemia: Secondary | ICD-10-CM

## 2018-06-27 DIAGNOSIS — Z794 Long term (current) use of insulin: Secondary | ICD-10-CM

## 2018-06-27 LAB — POCT GLYCOSYLATED HEMOGLOBIN (HGB A1C): Hemoglobin A1C: 12 % — AB (ref 4.0–5.6)

## 2018-06-27 MED ORDER — INSULIN PEN NEEDLE 31G X 5 MM MISC: 3 refills | Status: DC

## 2018-06-27 MED ORDER — DAPAGLIFLOZIN PROPANEDIOL 5 MG PO TABS: 5.0000 mg | ORAL_TABLET | Freq: Every day | ORAL | 2 refills | Status: DC

## 2018-06-27 NOTE — Patient Instructions (Signed)
Insulin 90 in am and 120 at other times  Check blood sugars on waking up days a week  Also check blood sugars about 2 hours after meals and do this after different meals by rotation  Recommended blood sugar levels on waking up are 90-130 and about 2 hours after meal is 130-160  Please bring your blood sugar monitor to each visit, thank you

## 2018-07-10 DIAGNOSIS — E113511 Type 2 diabetes mellitus with proliferative diabetic retinopathy with macular edema, right eye: Secondary | ICD-10-CM | POA: Diagnosis not present

## 2018-07-10 DIAGNOSIS — E113412 Type 2 diabetes mellitus with severe nonproliferative diabetic retinopathy with macular edema, left eye: Secondary | ICD-10-CM | POA: Diagnosis not present

## 2018-07-19 DIAGNOSIS — H2522 Age-related cataract, morgagnian type, left eye: Secondary | ICD-10-CM | POA: Diagnosis not present

## 2018-07-19 DIAGNOSIS — H2512 Age-related nuclear cataract, left eye: Secondary | ICD-10-CM | POA: Diagnosis not present

## 2018-07-19 DIAGNOSIS — H25812 Combined forms of age-related cataract, left eye: Secondary | ICD-10-CM | POA: Diagnosis not present

## 2018-08-07 DIAGNOSIS — E113412 Type 2 diabetes mellitus with severe nonproliferative diabetic retinopathy with macular edema, left eye: Secondary | ICD-10-CM | POA: Diagnosis not present

## 2018-08-07 DIAGNOSIS — H3582 Retinal ischemia: Secondary | ICD-10-CM | POA: Diagnosis not present

## 2018-08-07 DIAGNOSIS — E113511 Type 2 diabetes mellitus with proliferative diabetic retinopathy with macular edema, right eye: Secondary | ICD-10-CM | POA: Diagnosis not present

## 2018-08-07 DIAGNOSIS — H43822 Vitreomacular adhesion, left eye: Secondary | ICD-10-CM | POA: Diagnosis not present

## 2018-08-23 ENCOUNTER — Ambulatory Visit
Admission: RE | Admit: 2018-08-23 | Discharge: 2018-08-23 | Disposition: A | Discharge status: Home / Self Care | Payer: BLUE CROSS/BLUE SHIELD | Source: Ambulatory Visit | Attending: Family Medicine | Admitting: Family Medicine

## 2018-08-23 ENCOUNTER — Other Ambulatory Visit: Payer: Self-pay | Admitting: Family Medicine

## 2018-08-23 DIAGNOSIS — R7611 Nonspecific reaction to tuberculin skin test without active tuberculosis: Secondary | ICD-10-CM | POA: Diagnosis not present

## 2018-08-31 DIAGNOSIS — E113412 Type 2 diabetes mellitus with severe nonproliferative diabetic retinopathy with macular edema, left eye: Secondary | ICD-10-CM | POA: Diagnosis not present

## 2018-09-14 DIAGNOSIS — E113412 Type 2 diabetes mellitus with severe nonproliferative diabetic retinopathy with macular edema, left eye: Secondary | ICD-10-CM | POA: Diagnosis not present

## 2018-09-14 DIAGNOSIS — E113511 Type 2 diabetes mellitus with proliferative diabetic retinopathy with macular edema, right eye: Secondary | ICD-10-CM | POA: Diagnosis not present

## 2018-09-26 ENCOUNTER — Other Ambulatory Visit (INDEPENDENT_AMBULATORY_CARE_PROVIDER_SITE_OTHER): Payer: BLUE CROSS/BLUE SHIELD

## 2018-09-26 ENCOUNTER — Other Ambulatory Visit: Payer: Self-pay

## 2018-09-26 DIAGNOSIS — Z794 Long term (current) use of insulin: Secondary | ICD-10-CM

## 2018-09-26 DIAGNOSIS — E1165 Type 2 diabetes mellitus with hyperglycemia: Secondary | ICD-10-CM | POA: Diagnosis not present

## 2018-09-26 LAB — COMPREHENSIVE METABOLIC PANEL
ALT: 15 U/L (ref 0–35)
AST: 15 U/L (ref 0–37)
Albumin: 4.3 g/dL (ref 3.5–5.2)
Alkaline Phosphatase: 148 U/L — ABNORMAL HIGH (ref 39–117)
BUN: 16 mg/dL (ref 6–23)
CO2: 29 mEq/L (ref 19–32)
Calcium: 10.3 mg/dL (ref 8.4–10.5)
Chloride: 96 mEq/L (ref 96–112)
Creatinine, Ser: 0.71 mg/dL (ref 0.40–1.20)
GFR: 85.39 mL/min (ref >60.00)
Glucose, Bld: 362 mg/dL — ABNORMAL HIGH (ref 70–99)
Potassium: 3.3 mEq/L — ABNORMAL LOW (ref 3.5–5.1)
Sodium: 135 mEq/L (ref 135–145)
Total Bilirubin: 0.3 mg/dL (ref 0.2–1.2)
Total Protein: 7.6 g/dL (ref 6.0–8.3)

## 2018-09-26 LAB — LDL CHOLESTEROL, DIRECT: Direct LDL: 125 mg/dL

## 2018-09-26 LAB — MICROALBUMIN / CREATININE URINE RATIO
Creatinine,U: 53 mg/dL
Microalb Creat Ratio: 1.3 mg/g (ref 0.0–30.0)
Microalb, Ur: <0.7 mg/dL (ref 0.0–1.9)

## 2018-09-26 LAB — HEMOGLOBIN A1C: Hgb A1c MFr Bld: 13.5 % — ABNORMAL HIGH (ref 4.6–6.5)

## 2018-09-26 LAB — LIPID PANEL
Cholesterol: 212 mg/dL — ABNORMAL HIGH (ref 0–200)
HDL: 53.8 mg/dL (ref >39.00)
NonHDL: 158.35
Total CHOL/HDL Ratio: 4
Triglycerides: 265 mg/dL — ABNORMAL HIGH (ref 0.0–149.0)
VLDL: 53 mg/dL — ABNORMAL HIGH (ref 0.0–40.0)

## 2018-09-28 ENCOUNTER — Encounter: Payer: Self-pay | Admitting: Endocrinology

## 2018-09-28 ENCOUNTER — Ambulatory Visit (INDEPENDENT_AMBULATORY_CARE_PROVIDER_SITE_OTHER): Payer: BLUE CROSS/BLUE SHIELD | Admitting: Endocrinology

## 2018-09-28 ENCOUNTER — Other Ambulatory Visit: Payer: Self-pay

## 2018-09-28 VITALS — BP 144/88 | HR 92 | Ht 63.0 in | Wt 179.6 lb

## 2018-09-28 DIAGNOSIS — E876 Hypokalemia: Secondary | ICD-10-CM

## 2018-09-28 DIAGNOSIS — E782 Mixed hyperlipidemia: Secondary | ICD-10-CM

## 2018-09-28 DIAGNOSIS — Z794 Long term (current) use of insulin: Secondary | ICD-10-CM

## 2018-09-28 DIAGNOSIS — E1165 Type 2 diabetes mellitus with hyperglycemia: Secondary | ICD-10-CM | POA: Diagnosis not present

## 2018-09-28 DIAGNOSIS — I10 Essential (primary) hypertension: Secondary | ICD-10-CM

## 2018-09-28 MED ORDER — POTASSIUM CHLORIDE 20 MEQ/15ML (10%) PO SOLN: 20.0000 meq | Freq: Every day | ORAL | 0 refills | Status: DC

## 2018-09-28 MED ORDER — FREESTYLE LIBRE 14 DAY READER DEVI: 1.0000 | Freq: Once | 0 refills | Status: AC

## 2018-09-28 MED ORDER — FREESTYLE LIBRE 14 DAY SENSOR MISC: 1.0000 [IU] | 4 refills | Status: DC

## 2018-09-28 MED ORDER — DAPAGLIFLOZIN PROPANEDIOL 10 MG PO TABS: 10.0000 mg | ORAL_TABLET | Freq: Every day | ORAL | 2 refills | Status: DC

## 2018-09-28 NOTE — Progress Notes (Signed)
Patient ID: Tammy Chan, female   DOB: August 17, 1965, 53 y.o.   MRN: [id number]          Reason for Appointment:  follow-up  for Type 2 Diabetes   History of Present Illness:          Date of diagnosis of type 2 diabetes mellitus: 1986       Background history:   She thinks she was started on insulin at time of diagnosis, this was at the age of about 53 She also had tried some oral medications which apparently did not work including metformin Over the years she has taken various insulin regimens including premixed insulin, Lantus and NovoLog Detailed records are not available and not clear what her level of control has been A1c in 2014 and 2015 at [facility] was around 14  Recent history:   INSULIN regimen is: Humulin R U-500 insulin: 60 units at bedtime around 9 AM and 50 units at breakfast around 6 PM  A1c is getting progressively higher and now 13.5 compared to 12  She works night shifts   Non-insulin hypoglycemic drugs the patient is taking are: Metformin 500 mg twice daily Farxiga ir  Current management, blood sugar patterns and problems identified:  She is still not taking [place] even though she was able to get the prescription without cost and took it only for the first 15 days a month ago  Did not have any side effects  Although she brought her blood sugar monitor this time she has only 1 reading about a month ago and not clear if her test strips were expired  She stopped taking her insulin at lunchtime even though she was taking up to 100 units previously, she says she would feel her head spinning with taking the insulin  Her weight has gone up 4 pounds  She does not have any meal planning and no exercise regimen  She was told to increase her insulin to as much as 120 units of the last visit but she is taking at least 20 units less than the previously prescribed dose       Side effects from medications have been: Diarrhea from Ozempic, nausea from metformin,  difficulty swallowing large tablets like metformin  Mealtimes:  lunch around 4 PM and dinner at 11PM  Compliance with the medical regimen: Fair Hypoglycemia:  None  Glucose monitoring:  done 0-1 times a day         Glucometer:  Contour  Blood sugar not available, had a blood sugar of 74 late afternoon on 3/4   Self-care:  Meal times are:  Breakfast is 9 am Lunch: 11 pm Dinner: 6 pm  Typical meal intake: Breakfast is eggs, toast or cereal, Lunch Is a sandwich, evening meal is  chicken and some rice    Dietician visit, most recent: 8/19  CDE consultation: 2/18                Exercise:  walking a little some times  Weight history:  Wt Readings from Last 3 Encounters:  09/28/18 179 lb 9.6 oz (81.5 kg)  06/27/18 175 lb 6.4 oz (79.6 kg)  03/22/18 175 lb 3.2 oz (79.5 kg)    Glycemic control: A1c in 1/18 was 15.5    Lab Results  Component Value Date   HGBA1C 13.5 (H) 09/26/2018   HGBA1C 12.0 (A) 06/27/2018   HGBA1C 11.6 (A) 03/22/2018   Lab Results  Component Value Date   MICROALBUR <0.7 09/26/2018  LDLCALC 95 09/30/2017   CREATININE 0.71 09/26/2018   Lab Results  Component Value Date   MICRALBCREAT 1.3 09/26/2018   Lab Results  Component Value Date   FRUCTOSAMINE 455 (H) 06/19/2018   FRUCTOSAMINE 418 (H) 09/30/2017   FRUCTOSAMINE 323 (H) 09/27/2016       Allergies as of 09/28/2018      Reactions   Ozempic [semaglutide] Diarrhea   Erythromycin Nausea And Vomiting   Lisinopril Itching, Other (See Comments), Cough   Reaction:  Throat itching       Medication List       Accurate as of September 28, 2018 11:06 AM. Always use your most recent med list.        dapagliflozin propanediol 10 MG Tabs tablet Commonly known as:  Farxiga Take 10 mg by mouth daily.   FreeStyle Libre 14 Day Reader Hardie Pulley 1 Device by Does not apply route once for 1 dose.   FreeStyle Libre 14 Day Sensor Misc 1 Units by Does not apply route every 14 (fourteen) days.   gabapentin 300  MG capsule Commonly known as:  NEURONTIN Take 1 capsule (300 mg total) by mouth 3 (three) times daily.   glucose blood test strip Commonly known as:  Contour Next Test 1 each by Other route as needed for other. Use as instructed to check blood sugar 1 time daily   Insulin Pen Needle 31G X 5 MM Misc Commonly known as:  B-D UF III MINI PEN NEEDLES USE WITH INSULIN PEN 3 TIMES A DAY   insulin regular human CONCENTRATED 500 UNIT/ML kwikpen Commonly known as:  HumuLIN R U-500 KwikPen 100 UNITS, 30 MINUTES BEFORE EACH MEAL OR AS DIRECTED   simvastatin 40 MG tablet Commonly known as:  ZOCOR Take 40 mg by mouth at bedtime.   valsartan 160 MG tablet Commonly known as:  DIOVAN Take 160 mg by mouth daily. Take 1 tablet by mouth once daily.       Allergies:  Allergies  Allergen Reactions  . Ozempic [Semaglutide] Diarrhea  . Erythromycin Nausea And Vomiting  . Lisinopril Itching, Other (See Comments) and Cough    Reaction:  Throat itching     Past Medical History:  Diagnosis Date  . Diabetes mellitus without complication (HCC)   . Hyperlipidemia   . Hypertension   . Neuromuscular disorder (HCC)   . Neuropathy     Past Surgical History:  Procedure Laterality Date  . TUBAL LIGATION  2003    Family History  Problem Relation Age of Onset  . Hypertension Mother   . Heart disease Mother   . Hypertension Father   . Hypertension Sister   . Hypertension Sister   . Diabetes Maternal Grandfather   . Diabetes Paternal Grandfather     Social History:  reports that she has never smoked. She has never used smokeless tobacco. She reports that she does not drink alcohol or use drugs.   Review of Systems   Lipid history: On simvastatin 40 mg for several years from her PCP Last lipid panel shows increased lipids and she does not take her medication regularly     Lab Results  Component Value Date   CHOL 212 (H) 09/26/2018   HDL 53.80 09/26/2018   LDLCALC 95 09/30/2017    LDLDIRECT 125.0 09/26/2018   TRIG 265.0 (H) 09/26/2018   CHOLHDL 4 09/26/2018           Hypertension: Treated by PCP, currently on valsartan from her PCP, previously losartan 50  mg She does not check her blood pressure  BP Readings from Last 3 Encounters:  09/28/18 (!) 144/88  06/27/18 138/90  03/22/18 138/88     She has eye exams almost every month, reports of these will be requested Has proliferative retinopathy No recent reports available  Most recent foot exam: 12/19  Complications of diabetes: Has symptoms of neuropathy, having symptoms of burning and numbness , usually getting relief on gabapentin hs    She says she has longstanding difficulty swallowing tablets and certain foods and this is not new  Physical Examination:  BP (!) 144/88 (BP Location: Left Arm, Patient Position: Sitting, Cuff Size: Large)   Pulse 92   Ht  (1.6 m)   Wt 179 lb 9.6 oz (81.5 kg)   LMP  (LMP Unknown)   SpO2 99%   BMI 31.81 kg/m      ASSESSMENT:  Diabetes type 2, insulin resistant with complications, BMI 31  See history of present illness for detailed discussion of current diabetes management, blood sugar patterns and problems identified  A1c is still unacceptably high and getting higher progressively  She is totally unmotivated to take care of her diabetes with lack of glucose monitoring, cutting back on her insulin instead of increasing it, weight gain from inconsistent diet and no exercise Discussed that she is getting progressive complications from her poorly controlled diabetes and that her average blood sugar is well over 300  Also difficult to know whether she is benefiting from [place] which she only took for 15 days Discussed that her symptoms of vertigo were not related to taking insulin and she needs to cover her lunch meal with insulin which she is not doing  Today discussed the possibility of continuous glucose monitoring and insulin pumps as ways of getting  better control  HYPERTENSION: Blood pressure is persistently high with current regimen of valsartan which she just started She needs to follow-up with PCP Also may benefit from adding Farxiga  HYPOKALEMIA: Potassium was 3.3 and likely to be from poor diabetes control and hypomagnesemia.  She is reluctant to take any tablets  PLAN:   She will start doing continuous glucose monitoring with the freestyle libre and explained to her how this would work, and she will see if this is covered by her insurance  May need assistance in starting this  We will set her up for follow-up with diabetes educator since she has poor understanding of insulin, blood sugar targets and need for better control as well as review of diet  She needs considerable encouragement to improve her self-care  She will need to bring her monitor on the next visit  Discussed possibility of insulin pump but unlikely that she would be able to comply with this  Increase Farxiga to 10 mg and start taking this daily before her first meal of the day  Her insulin dose will be at least 70 units when she comes back from work  She will take 70 to 80 units before lunch which will need to be increased if her sugars when she comes back home are still at least more than 200  She will increase her 6 PM insulin dose to 100 units  Discussed that we cannot adjust her insulin without adequate glucose monitoring and she can also keep up with her insulin doses better with more blood sugars either by fingerstick or CGM  Potassium to be supplemented, she can try liquid since she cannot swallow tablets  Needs to take  her simvastatin daily and not skip doses     Patient Instructions  Take 2 Farxiga on waking up   Take 70 -80 units insulin before lunch daily at work and go up if am sugar high   Take 70 units in am and 100 units at 6 pm  Check blood sugars on waking up 4 days a week  Also check blood sugars about 2 hours after meals  and do this after different meals by rotation  Recommended blood sugar levels on waking up are 90-130 and about 2 hours after meal is 130-160  Please bring your blood sugar monitor to each visit, thank you      Counseling time on subjects discussed in assessment and plan sections is over 50% of today's 25 minute visit     Reather Littler 09/28/2018, 11:06 AM   Note: This office note was prepared with Dragon voice recognition system technology. Any transcriptional errors that result from this process are unintentional.

## 2018-09-28 NOTE — Patient Instructions (Addendum)
Take 2 Farxiga on waking up   Take 70 -80 units insulin before lunch daily at work and go up if am sugar high   Take 70 units in am and 100 units at 6 pm  Check blood sugars on waking up 4 days a week  Also check blood sugars about 2 hours after meals and do this after different meals by rotation  Recommended blood sugar levels on waking up are 90-130 and about 2 hours after meal is 130-160  Please bring your blood sugar monitor to each visit, thank you

## 2018-10-17 ENCOUNTER — Encounter: Payer: BLUE CROSS/BLUE SHIELD | Attending: Endocrinology | Admitting: Nutrition

## 2018-10-17 ENCOUNTER — Other Ambulatory Visit: Payer: Self-pay

## 2018-10-17 DIAGNOSIS — Z794 Long term (current) use of insulin: Secondary | ICD-10-CM | POA: Diagnosis not present

## 2018-10-17 DIAGNOSIS — E1165 Type 2 diabetes mellitus with hyperglycemia: Secondary | ICD-10-CM | POA: Diagnosis not present

## 2018-10-21 ENCOUNTER — Other Ambulatory Visit: Payer: Self-pay | Admitting: Endocrinology

## 2018-10-27 DIAGNOSIS — E113412 Type 2 diabetes mellitus with severe nonproliferative diabetic retinopathy with macular edema, left eye: Secondary | ICD-10-CM | POA: Diagnosis not present

## 2018-10-27 DIAGNOSIS — E113511 Type 2 diabetes mellitus with proliferative diabetic retinopathy with macular edema, right eye: Secondary | ICD-10-CM | POA: Diagnosis not present

## 2018-11-04 ENCOUNTER — Other Ambulatory Visit: Payer: Self-pay | Admitting: Endocrinology

## 2018-11-26 NOTE — Progress Notes (Signed)
We discussed how this sensor works and the need to scan q 8 hours.  She inserted the sensor in her left arm and was shown how to "start the sensor", and then how to get a reading.  She was encouraged to read over the directions for use.  Also discussed goals for blood sugars and the need to take the insulin before meals.  She was encouraged to look at the readings, when she forgets to take her insulin to help her remember the need for this.  Discussed the effects of high readings on her body.  She reported good understanding of this with no final questons.

## 2018-11-28 ENCOUNTER — Other Ambulatory Visit: Payer: Self-pay

## 2018-11-28 ENCOUNTER — Telehealth: Payer: Self-pay

## 2018-11-28 NOTE — Telephone Encounter (Signed)
PA initiated via [email or web address] for Freestyle libre 14 day sensor. Key: [id number]

## 2018-11-29 NOTE — Telephone Encounter (Signed)
Received fax from [facility] [place] stating that pt was approved for freestyle libre 14 day sensors.

## 2018-11-30 ENCOUNTER — Ambulatory Visit: Payer: Self-pay | Admitting: Endocrinology

## 2019-01-28 ENCOUNTER — Emergency Department (HOSPITAL_COMMUNITY): Payer: Self-pay

## 2019-01-28 ENCOUNTER — Emergency Department (HOSPITAL_COMMUNITY)
Admission: EM | Admit: 2019-01-28 | Discharge: 2019-01-28 | Disposition: A | Discharge status: Home / Self Care | Payer: Self-pay | Attending: Emergency Medicine | Admitting: Emergency Medicine

## 2019-01-28 ENCOUNTER — Encounter (HOSPITAL_COMMUNITY): Payer: Self-pay

## 2019-01-28 ENCOUNTER — Other Ambulatory Visit: Payer: Self-pay

## 2019-01-28 DIAGNOSIS — I10 Essential (primary) hypertension: Secondary | ICD-10-CM | POA: Insufficient documentation

## 2019-01-28 DIAGNOSIS — Z79899 Other long term (current) drug therapy: Secondary | ICD-10-CM | POA: Insufficient documentation

## 2019-01-28 DIAGNOSIS — R911 Solitary pulmonary nodule: Secondary | ICD-10-CM | POA: Insufficient documentation

## 2019-01-28 DIAGNOSIS — Z794 Long term (current) use of insulin: Secondary | ICD-10-CM | POA: Insufficient documentation

## 2019-01-28 DIAGNOSIS — R0789 Other chest pain: Secondary | ICD-10-CM | POA: Insufficient documentation

## 2019-01-28 DIAGNOSIS — E1165 Type 2 diabetes mellitus with hyperglycemia: Secondary | ICD-10-CM | POA: Insufficient documentation

## 2019-01-28 DIAGNOSIS — R739 Hyperglycemia, unspecified: Secondary | ICD-10-CM

## 2019-01-28 LAB — CBC
HCT: 49.4 % — ABNORMAL HIGH (ref 36.0–46.0)
Hemoglobin: 15.9 g/dL — ABNORMAL HIGH (ref 12.0–15.0)
MCH: 29.9 pg (ref 26.0–34.0)
MCHC: 32.2 g/dL (ref 30.0–36.0)
MCV: 93 fL (ref 80.0–100.0)
Platelets: 299 10*3/uL (ref 150–400)
RBC: 5.31 MIL/uL — ABNORMAL HIGH (ref 3.87–5.11)
RDW: 12.9 % (ref 11.5–15.5)
WBC: 14.1 10*3/uL — ABNORMAL HIGH (ref 4.0–10.5)
nRBC: 0 % (ref 0.0–0.2)

## 2019-01-28 LAB — BASIC METABOLIC PANEL
Anion gap: 13 (ref 5–15)
Anion gap: 13 (ref 5–15)
BUN: 15 mg/dL (ref 6–20)
BUN: 13 mg/dL (ref 6–20)
CO2: 24 mmol/L (ref 22–32)
CO2: 26 mmol/L (ref 22–32)
Calcium: 9.5 mg/dL (ref 8.9–10.3)
Calcium: 9.3 mg/dL (ref 8.9–10.3)
Chloride: 97 mmol/L — ABNORMAL LOW (ref 98–111)
Chloride: 99 mmol/L (ref 98–111)
Creatinine, Ser: 0.79 mg/dL (ref 0.44–1.00)
Creatinine, Ser: 0.62 mg/dL (ref 0.44–1.00)
GFR calc Af Amer: >60 mL/min (ref >60)
GFR calc Af Amer: >60 mL/min (ref >60)
GFR calc non Af Amer: >60 mL/min (ref >60)
GFR calc non Af Amer: >60 mL/min (ref >60)
Glucose, Bld: 489 mg/dL — ABNORMAL HIGH (ref 70–99)
Glucose, Bld: 390 mg/dL — ABNORMAL HIGH (ref 70–99)
Potassium: 5.5 mmol/L — ABNORMAL HIGH (ref 3.5–5.1)
Potassium: 4.6 mmol/L (ref 3.5–5.1)
Sodium: 134 mmol/L — ABNORMAL LOW (ref 135–145)
Sodium: 138 mmol/L (ref 135–145)

## 2019-01-28 LAB — D-DIMER, QUANTITATIVE: D-Dimer, Quant: 0.65 ug/mL-FEU — ABNORMAL HIGH (ref 0.00–0.50)

## 2019-01-28 LAB — I-STAT BETA HCG BLOOD, ED (NOT ORDERABLE): I-stat hCG, quantitative: <5 m[IU]/mL (ref <5)

## 2019-01-28 LAB — TROPONIN I (HIGH SENSITIVITY)
Troponin I (High Sensitivity): 3 ng/L (ref <18)
Troponin I (High Sensitivity): 3 ng/L (ref <18)

## 2019-01-28 MED ORDER — LIDOCAINE 5 % EX PTCH: 1.0000 | MEDICATED_PATCH | CUTANEOUS | 0 refills | Status: DC

## 2019-01-28 MED ORDER — SODIUM CHLORIDE 0.9 % IV BOLUS
1000.0000 mL | Freq: Once | INTRAVENOUS | Status: AC
  Administered 2019-01-28: 1000 mL via INTRAVENOUS

## 2019-01-28 MED ORDER — IOHEXOL 350 MG/ML SOLN
100.0000 mL | Freq: Once | INTRAVENOUS | Status: AC | PRN
  Administered 2019-01-28: 100 mL via INTRAVENOUS

## 2019-01-28 MED ORDER — ACETAMINOPHEN 500 MG PO TABS
1000.0000 mg | ORAL_TABLET | Freq: Once | ORAL | Status: AC
  Administered 2019-01-28: 1000 mg via ORAL
  Filled 2019-01-28: qty 2

## 2019-01-28 MED ORDER — SODIUM CHLORIDE (PF) 0.9 % IJ SOLN
INTRAMUSCULAR | Status: AC
  Filled 2019-01-28: qty 50

## 2019-01-28 MED ORDER — SODIUM CHLORIDE 0.9% FLUSH
3.0000 mL | Freq: Once | INTRAVENOUS | Status: AC
  Administered 2019-01-28: 3 mL via INTRAVENOUS

## 2019-01-28 MED ORDER — LIDOCAINE 5 % EX PTCH
1.0000 | MEDICATED_PATCH | CUTANEOUS | Status: DC
  Administered 2019-01-28: 1 via TRANSDERMAL
  Filled 2019-01-28: qty 1

## 2019-01-28 NOTE — ED Notes (Signed)
Pt states she has not given herself insulin since some time yesterday then laughs, proceeds to state she just finished a soda in the lobby. When asked if it was diet, "no regular but I have my insulin with me" laughs again.

## 2019-01-28 NOTE — Discharge Instructions (Signed)
Please read and follow all provided instructions.  Your diagnoses today include:  1. Right-sided chest wall pain   2. Hyperglycemia   3. Pulmonary nodule     Tests performed today include: An EKG of your heart A chest x-ray Cardiac enzymes - a blood test for heart muscle damage Blood counts and electrolytes Vital signs. See below for your results today.   Your CT scan does show that you have a very small pulmonary nodule.  This can be followed by your primary care provider. They will decide if you need follow up.  Medications prescribed:   Take any prescribed medications only as directed.  Please ensure that you take your home insulin as prescribed.  Your sugar was quite high today.  I recommend Tylenol as well as topical medication for the pain such as Salon PAS patches on the anterior chest.   Follow-up instructions: Please follow-up with your primary care provider as soon as you can for further evaluation of your symptoms.   Return instructions:  SEEK IMMEDIATE MEDICAL ATTENTION IF: You have severe chest pain, especially if the pain is crushing or pressure-like and spreads to the arms, back, neck, or jaw, or if you have sweating, nausea (feeling sick to your stomach), or shortness of breath. THIS IS AN EMERGENCY. Don't wait to see if the pain will go away. Get medical help at once. Call 911 or 0 (operator). DO NOT drive yourself to the hospital.  Your chest pain gets worse and does not go away with rest.  You have an attack of chest pain lasting longer than usual, despite rest and treatment with the medications your caregiver has prescribed.  You wake from sleep with chest pain or shortness of breath. You feel dizzy or faint. You have chest pain not typical of your usual pain for which you originally saw your caregiver.  You have any other emergent concerns regarding your health.  Additional Information: Chest pain comes from many different causes. Your caregiver has  diagnosed you as having chest pain that is not specific for one problem, but does not require admission.  You are at low risk for an acute heart condition or other serious illness.   Your vital signs today were: BP (!) 160/98    Pulse 96    Temp 98.8 F (37.1 C) (Oral)    Resp (!) 24    Ht 5\' 3"  (1.6 m)    Wt 78 kg    LMP  (LMP Unknown)    SpO2 100%    BMI 30.47 kg/m  If your blood pressure (BP) was elevated above 135/85 this visit, please have this repeated by your doctor within one month. --------------

## 2019-01-28 NOTE — ED Triage Notes (Signed)
Pt states she has central chest pain that radiates to her back and down her right arm. Pt states she has had this for 2-3 days and has not resolved itself.

## 2019-01-28 NOTE — ED Provider Notes (Signed)
[facility]-EMERGENCY DEPT Provider Note   CSN: [id number] Arrival date & time: 01/28/19  0809     History   Chief Complaint Chief Complaint  Patient presents with   Chest Pain    HPI Tammy Chan is a 53 y.o. female.     HPI   Patient is a 53 year old female past medical history of 2 diabetes mellitus, hypertension, hyperlipidemia, neuropathy presenting for right-sided chest wall and back pain.  Patient reports that her symptoms began 2 days ago.  She reports any movement, laying on her right side, rotating her right arm will cause pain. Patient reports some pain with deep breathing.  She denies any cough, fever or hemoptysis.  Patient denies any recent immobilization, hospitalization, recent surgery, hormone use, cancer treatment, lower extremity edema or calf tenderness.  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  Patient denies any history of CAD, stress test or cardiac catheterization.  No family history of early MI.  She is a non-smoker.  Patient does report that she does not frequently take her blood sugar and does miss doses of insulin.  Past Medical History:  Diagnosis Date   Diabetes mellitus without complication (HCC)    Hyperlipidemia    Hypertension    Neuromuscular disorder (HCC)    Neuropathy     Patient Active Problem List   Diagnosis Date Noted   Uncontrolled type 2 diabetes mellitus with hyperglycemia, with long-term current use of insulin (HCC) 08/05/2016   Left genital labial abscess 02/06/2014    Past Surgical History:  Procedure Laterality Date   TUBAL LIGATION  2003     OB History    Gravida  3   Para  3   Term      Preterm      AB      Living  3     SAB      TAB      Ectopic      Multiple      Live Births               Home Medications    Prior to Admission medications   Medication Sig Start Date End Date Taking?  Authorizing Provider  Continuous Blood Gluc Sensor (FREESTYLE LIBRE 14 DAY SENSOR) MISC 1 Units by Does not apply route every 14 (fourteen) days. 09/28/18  Yes Reather Littler, MD  dapagliflozin propanediol (FARXIGA) 10 MG TABS tablet Take 10 mg by mouth daily. 09/28/18  Yes Reather Littler, MD  glucose blood (CONTOUR NEXT TEST) test strip 1 each by Other route as needed for other. Use as instructed to check blood sugar 1 time daily 01/02/18  Yes Reather Littler, MD  Insulin Pen Needle (B-D UF III MINI PEN NEEDLES) 31G X 5 MM MISC USE WITH INSULIN PEN 3 TIMES A DAY 06/27/18  Yes Reather Littler, MD  insulin regular human CONCENTRATED (HUMULIN R U-500 KWIKPEN) 500 UNIT/ML kwikpen 100 UNITS, 30 MINUTES BEFORE EACH MEAL OR AS DIRECTED Patient taking differently: Inject 100 Units into the skin 3 (three) times daily with meals.  06/13/18  Yes Reather Littler, MD  potassium chloride 20 MEQ/15ML (10%) SOLN TAKE 15 MLS (20 MEQ TOTAL) BY MOUTH DAILY. 11/06/18  Yes Reather Littler, MD  simvastatin (ZOCOR) 40 MG tablet Take 40 mg by mouth at bedtime.   Yes [provider]  valsartan (DIOVAN) 160 MG tablet Take 160 mg by mouth  daily. Take 1 tablet by mouth once daily.   Yes [provider]  gabapentin (NEURONTIN) 300 MG capsule Take 1 capsule (300 mg total) by mouth 3 (three) times daily. Patient not taking: Reported on 01/28/2019 10/08/16   Reather LittlerKumar, Ajay, MD    Family History Family History  Problem Relation Age of Onset   Hypertension Mother    Heart disease Mother    Hypertension Father    Hypertension Sister    Hypertension Sister    Diabetes Maternal Grandfather    Diabetes Paternal Grandfather     Social History Social History   Tobacco Use   Smoking status: Never Smoker   Smokeless tobacco: Never Used  Substance Use Topics   Alcohol use: No    Comment: socical   Drug use: No     Allergies   Ozempic [semaglutide], Erythromycin, and Lisinopril   Review of Systems Review of Systems    Constitutional: Negative for chills and fever.  HENT: Negative for congestion and sore throat.   Eyes: Negative for visual disturbance.  Respiratory: Negative for cough, chest tightness and shortness of breath.   Cardiovascular: Positive for chest pain. Negative for palpitations and leg swelling.  Gastrointestinal: Negative for abdominal pain, nausea and vomiting.  Genitourinary: Negative for dysuria and flank pain.  Musculoskeletal: Positive for arthralgias and myalgias. Negative for back pain.  Skin: Negative for rash.  Neurological: Negative for dizziness, syncope and light-headedness.     Physical Exam Updated Vital Signs BP (!) 160/98    Pulse 96    Temp 98.8 F (37.1 C) (Oral)    Resp (!) 24    Ht 5\' 3"  (1.6 m)    Wt 78 kg    LMP  (LMP Unknown)    SpO2 100%    BMI 30.47 kg/m   Physical Exam Vitals signs and nursing note reviewed.  Constitutional:      General: She is not in acute distress.    Appearance: She is well-developed.  HENT:     Head: Normocephalic and atraumatic.  Eyes:     Conjunctiva/sclera: Conjunctivae normal.     Pupils: Pupils are equal, round, and reactive to light.  Neck:     Musculoskeletal: Normal range of motion and neck supple.  Cardiovascular:     Rate and Rhythm: Normal rate and regular rhythm.     Pulses:          Radial pulses are 2+ on the right side and 2+ on the left side.       Dorsalis pedis pulses are 2+ on the right side and 2+ on the left side.     Heart sounds: S1 normal and S2 normal. No murmur.  Pulmonary:     Effort: Pulmonary effort is normal. No tachypnea or respiratory distress.     Breath sounds: Normal breath sounds. No wheezing or rales.  Chest:     Chest wall: Tenderness present.     Comments: Chest wall TTP across the right side of the chest, beneath right breast and wrapping around to the right thoracic region.  Abdominal:     General: There is no distension.     Palpations: Abdomen is soft.     Tenderness: There is  no abdominal tenderness. There is no guarding.  Musculoskeletal: Normal range of motion.        General: No deformity.     Right lower leg: She exhibits no tenderness. No edema.     Left lower leg: She exhibits  no tenderness. No edema.  Lymphadenopathy:     Cervical: No cervical adenopathy.  Skin:    General: Skin is warm and dry.     Findings: No erythema or rash.     Comments: Skin of the right chest and thoracic region examined without evidence of erythema or bullae suggestive of herpes zoster.  Neurological:     Mental Status: She is alert.     Comments: Cranial nerves grossly intact. Patient moves extremities symmetrically and with good coordination.  Psychiatric:        Behavior: Behavior normal.        Thought Content: Thought content normal.        Judgment: Judgment normal.      ED Treatments / Results  Labs (all labs ordered are listed, but only abnormal results are displayed) Labs Reviewed  BASIC METABOLIC PANEL - Abnormal; Notable for the following components:      Result Value   Sodium 134 (*)    Potassium 5.5 (*)    Chloride 97 (*)    Glucose, Bld 489 (*)    All other components within normal limits  CBC - Abnormal; Notable for the following components:   WBC 14.1 (*)    RBC 5.31 (*)    Hemoglobin 15.9 (*)    HCT 49.4 (*)    All other components within normal limits  D-DIMER, QUANTITATIVE (NOT AT [facility]) - Abnormal; Notable for the following components:   D-Dimer, Quant 0.65 (*)    All other components within normal limits  BASIC METABOLIC PANEL - Abnormal; Notable for the following components:   Glucose, Bld 390 (*)    All other components within normal limits  I-STAT BETA HCG BLOOD, ED ([facility], [facility], [facility] ONLY)  I-STAT BETA HCG BLOOD, ED (NOT ORDERABLE)  CBG MONITORING, ED  TROPONIN I (HIGH SENSITIVITY)  TROPONIN I (HIGH SENSITIVITY)    EKG EKG Interpretation  Date/Time:  Sunday January 28 2019 08:24:51 EDT Ventricular Rate:  107 PR Interval:    QRS  Duration: 78 QT Interval:  366 QTC Calculation: 489 R Axis:   -39 Text Interpretation:  Sinus tachycardia LAE, consider biatrial enlargement Abnormal R-wave progression, late transition Left ventricular hypertrophy Borderline T abnormalities, anterior leads Borderline prolonged QT interval Baseline wander in lead(s) V2 V3 No old tracing to compare Confirmed by Dorie Rank [phone]) on 01/28/2019 8:39:44 AM   Radiology Dg Chest 2 View  Result Date: 01/28/2019 CLINICAL DATA:  Chest pain radiating to the right arm. EXAM: CHEST - 2 VIEW COMPARISON:  August 23, 2018 FINDINGS: The heart size and mediastinal contours are within normal limits. Both lungs are clear. There is scoliosis of spine. The visualized skeletal structures are otherwise unremarkable. IMPRESSION: No active cardiopulmonary disease. Electronically Signed   By: Abelardo Diesel M.D.   On: 01/28/2019 10:08   Ct Angio Chest Pe W/cm &/or Wo Cm  Result Date: 01/28/2019 CLINICAL DATA:  Right chest pain, elevated D-dimer EXAM: CT ANGIOGRAPHY CHEST WITH CONTRAST TECHNIQUE: Multidetector CT imaging of the chest was performed using the standard protocol during bolus administration of intravenous contrast. Multiplanar CT image reconstructions and MIPs were obtained to evaluate the vascular anatomy. CONTRAST:  149mL OMNIPAQUE IOHEXOL 350 MG/ML SOLN COMPARISON:  None. FINDINGS: Cardiovascular: Heart size normal. No pericardial effusion. Satisfactory opacification of pulmonary arteries noted, and there is no evidence of pulmonary emboli. Adequate contrast opacification of the thoracic aorta with no evidence of dissection, aneurysm, or stenosis. There is classic 3-vessel brachiocephalic arch anatomy  without proximal stenosis. Mediastinum/Nodes: No enlarged mediastinal, hilar, or axillary lymph nodes. Thyroid gland, trachea, and esophagus demonstrate no significant findings. Lungs/Pleura: No pleural effusion. No pneumothorax. 5 mm nodule adjacent to the minor  fissure image 74/10, possibly intrapulmonary lymph node but nonspecific. Lungs otherwise clear. Upper Abdomen: No acute findings. Musculoskeletal: No chest wall abnormality. No acute or significant osseous findings. Review of the MIP images confirms the above findings. IMPRESSION: Negative for acute PE or thoracic aortic dissection. Electronically Signed   By: Corlis Leak  Hassell M.D.   On: 01/28/2019 14:02    Procedures Procedures (including critical care time)  Medications Ordered in ED Medications  sodium chloride (PF) 0.9 % injection (has no administration in time range)  lidocaine (LIDODERM) 5 % 1 patch (1 patch Transdermal Patch Applied 01/28/19 1421)  sodium chloride flush (NS) 0.9 % injection 3 mL (3 mLs Intravenous Given 01/28/19 1101)  sodium chloride 0.9 % bolus 1,000 mL (1,000 mLs Intravenous New Bag/Given 01/28/19 1249)  iohexol (OMNIPAQUE) 350 MG/ML injection 100 mL (100 mLs Intravenous Contrast Given 01/28/19 1310)  acetaminophen (TYLENOL) tablet 1,000 mg (1,000 mg Oral Given 01/28/19 1421)     Initial Impression / Assessment and Plan / ED Course  I have reviewed the triage vital signs and the nursing notes.  Pertinent labs & imaging results that were available during my care of the patient were reviewed by me and considered in my medical decision making (see chart for details).  Clinical Course as of Jan 27 1538  Wynelle LinkSun Jan 28, 2019  1523 Suspect hemolysis as cause of previous elevated K.  Potassium: 4.6 [AM]  1538 Unclear etiology. Will have pt follow up with PCP for recheck.   WBC(!): 14.1 [AM]    Clinical Course User Index [AM] Elisha PonderMurray, Lassie Demorest B, PA-C       Differential diagnosis includes ACS, PE, thoracic aortic dissection, Boerhaave's syndrome, cardiac tamponade, pneumothorax, incarcerated diaphragmatic hernia, cholecystitis, esophageal spasm, gastroesophageal reflux, herpes zoster of the thorax, pericarditis, pneumonia, chest wall pain, costochondritis.   Patient's pain seems to  be isolated within the chest wall and is reproducible, atypical for acute coronary syndrome.  Doubt ACS, as delta troponin is negative, initial and repeat EKGs show no signs of ischemia, infarction, or arrhythmia, and HEART score 4 (age, EKG, risk factors). Doubt PE as pt had a negative CTPA. Doubt TAD by hx, CXR showed no widening mediastinum, and pulses equal in all extremities. Patient remained nontoxic appearing and in no acute distress during emergency department course. Vital signs stable in the emergency department. Therefore, doubt esophageal rupture, cardiac tamponade, or pneumothorax. Pericarditis less likely due to no preceding infectious symptoms and pain not improved in upright positions. Abnormal labs include leukocytosis, hyperglycemia, and initial hyperkalemia that was found to be due to hemolysis.  Unclear etiology of leukocytosis.  Patient does not have many laboratory values in the chart to compare.  Will have patient follow-up with PCP. Incidentally found to have possible pulmonary nodule today.  Patient instructed to follow-up with primary care provider for further management and imaging is needed in the future.  Patient was counseled on the importance of taking insulin to control blood sugar.  She takes it inconsistently.   Discussed herpes zoster with patient. No lesions identified today, patient was advised that if she is to develop a rash, she needs to talk to her primary care provider about starting antiviral treatment.  Return precautions given for increasing pain, shortness of breath, dizziness, lightheadedness, syncope or presyncope.  Patient  and family understand and are in agreement with plan of care.  This is a supervised visit with Dr. Linwood DibblesJon Knapp. Evaluation, management, and discharge planning discussed with this attending physician.  Final Clinical Impressions(s) / ED Diagnoses   Final diagnoses:  Right-sided chest wall pain  Hyperglycemia  Pulmonary nodule    ED  Discharge Orders         Ordered    lidocaine (LIDODERM) 5 %  Every 24 hours     01/28/19 1549           Delia ChimesMurray, Snyder Colavito B, PA-C 01/28/19 [id number]    Knapp, Jon, MD 01/29/19 [id number]

## 2019-01-28 NOTE — ED Notes (Signed)
TWO UNSUCCESSFUL LAB COLLECTION ATTEMPT

## 2019-01-28 NOTE — ED Notes (Signed)
Patient transported to CT 

## 2019-04-27 ENCOUNTER — Other Ambulatory Visit: Payer: Self-pay

## 2019-04-27 ENCOUNTER — Other Ambulatory Visit: Payer: Self-pay | Admitting: Endocrinology

## 2019-04-27 DIAGNOSIS — E1165 Type 2 diabetes mellitus with hyperglycemia: Secondary | ICD-10-CM

## 2019-04-27 DIAGNOSIS — Z794 Long term (current) use of insulin: Secondary | ICD-10-CM

## 2019-04-27 DIAGNOSIS — E782 Mixed hyperlipidemia: Secondary | ICD-10-CM

## 2019-04-27 DIAGNOSIS — E876 Hypokalemia: Secondary | ICD-10-CM

## 2019-04-27 LAB — BASIC METABOLIC PANEL
BUN: 10 mg/dL (ref 6–23)
CO2: 29 mEq/L (ref 19–32)
Calcium: 9.3 mg/dL (ref 8.4–10.5)
Chloride: 103 mEq/L (ref 96–112)
Creatinine, Ser: 0.54 mg/dL (ref 0.40–1.20)
GFR: 116.86 mL/min (ref >60.00)
Glucose, Bld: 183 mg/dL — ABNORMAL HIGH (ref 70–99)
Potassium: 4.1 mEq/L (ref 3.5–5.1)
Sodium: 139 mEq/L (ref 135–145)

## 2019-04-27 LAB — LDL CHOLESTEROL, DIRECT: Direct LDL: 107 mg/dL

## 2019-04-27 LAB — HEMOGLOBIN A1C: Hgb A1c MFr Bld: 12.7 % — ABNORMAL HIGH (ref 4.6–6.5)

## 2019-04-28 LAB — FRUCTOSAMINE: Fructosamine: 430 umol/L — ABNORMAL HIGH (ref 0–285)

## 2019-04-30 ENCOUNTER — Other Ambulatory Visit: Payer: Self-pay

## 2019-05-03 ENCOUNTER — Other Ambulatory Visit: Payer: Self-pay

## 2019-05-07 ENCOUNTER — Other Ambulatory Visit: Payer: Self-pay

## 2019-05-07 ENCOUNTER — Encounter: Payer: Self-pay | Admitting: Endocrinology

## 2019-05-08 NOTE — Progress Notes (Signed)
This encounter was created in error - please disregard.

## 2019-05-10 ENCOUNTER — Telehealth: Payer: Self-pay

## 2019-05-10 ENCOUNTER — Other Ambulatory Visit: Payer: Self-pay

## 2019-05-10 MED ORDER — INSULIN NPH ISOPHANE & REGULAR (70-30) 100 UNIT/ML ~~LOC~~ SUSP: SUBCUTANEOUS | 3 refills | Status: DC

## 2019-05-10 NOTE — Telephone Encounter (Signed)
Pt stated that spoke with [employer] and they told her that the copay card was for pt's with commercial insurance only, and she recently lost her insurance.

## 2019-05-10 NOTE — Telephone Encounter (Signed)
She can try the 70/30 insulin vials, [employer] brand from [facility] and start with 70 units 3 times a day.  She can also apply for patient assistance from [employer]

## 2019-05-10 NOTE — Telephone Encounter (Signed)
Called pt and gave her MD message and sent Rx for 70/30

## 2019-05-10 NOTE — Telephone Encounter (Signed)
We have a co-pay card that may be able to reduce it down to $25 per month.  She needs to see if she can use this

## 2019-05-10 NOTE — Telephone Encounter (Signed)
insulin regular human CONCENTRATED (HUMULIN R U-500 KWIKPEN) 500 UNIT/ML kwikpen   Patient is no longer able to afford the insulin, wanting to know can you switch the medication to something that is not that expensive for her    Please call and advise

## 2019-05-23 ENCOUNTER — Other Ambulatory Visit: Payer: Self-pay

## 2019-05-27 NOTE — Progress Notes (Signed)
Patient ID: Tammy Chan, female   DOB: 1966-01-09, 53 y.o.   MRN: [id number]          Reason for Appointment:  follow-up  for Type 2 Diabetes   History of Present Illness:          Date of diagnosis of type 2 diabetes mellitus: 1986       Background history:   She thinks she was started on insulin at time of diagnosis, this was at the age of about 58 She also had tried some oral medications which apparently did not work including metformin Over the years she has taken various insulin regimens including premixed insulin, Lantus and NovoLog Detailed records are not available and not clear what her level of control has been A1c in 2014 and 2015 at [facility] was around 14  Recent history:   INSULIN regimen is: NPH/regular 70/30, 70 units 3 times daily Previously Humulin R U-500 insulin: 60 units at bedtime around 9 AM and 50 units at breakfast at 6 AM  A1c is still high at 12.7 as of 10/30  She has not been seen in follow-up since 09/2018   Non-insulin hypoglycemic drugs the patient is taking are:   Current management, blood sugar patterns and problems identified:  She is using the freestyle libre recently although on one occasion with sensor did fall off  Initially she used her stomach as a site but now using her upper arm on the outside  Because of lack of prescription insurance she is not taking [place] or the Humulin U-500  She is also working day shifts instead of night shifts as before  She was having reasonably good blood sugars for about a week but last week her blood sugars have been mostly significantly high and she cannot explain this  She thinks she is taking her insulin consistently as directed  Hyperglycemia can sometimes be lasting most of the day or night  She thinks she is taking her insulin long time  She has had some tendency to nocturnal hypoglycemia in the previous week with a blood sugar as low as 44 and she had symptoms on waking up  At  dinnertime she will sometimes have only noodle soup without any protein  With her CGM no consistent pattern is seen  She has not checked her fingerstick at the same time as her CGM to make sure it is accurate       Side effects from medications have been: Diarrhea from Ozempic, nausea from metformin, difficulty swallowing large tablets like metformin  Mealtimes:  lunch around 4 PM and dinner at 11PM  Glucose monitoring:  done 0-1 times a day         Glucometer:  Libre   CGM use % of time  79  2-week average/SD  172+/-51, GV  Time in range        54%  % Time Above 180  18  % Time above 250  21  % Time Below 70  7     PRE-MEAL Fasting Lunch Dinner Bedtime Overall  Glucose range:       Averages:  165    135  172   POST-MEAL PC Breakfast PC Lunch PC Dinner  Glucose range:     Averages:  187  191  176     Self-care:  Meal times are:  Breakfast is 9 am Lunch: 11 pm Dinner: 6 pm  Typical meal intake: Breakfast is eggs, toast or cereal, Lunch Is a  sandwich, evening meal is  chicken and some rice    Dietician visit, most recent: 8/19  CDE consultation: 2/18                Exercise:  walking a little some times  Weight history:  Wt Readings from Last 3 Encounters:  05/28/19 194 lb 12.8 oz (88.4 kg)  01/28/19 172 lb (78 kg)  09/28/18 179 lb 9.6 oz (81.5 kg)    Glycemic control: A1c in 1/18 was 15.5    Lab Results  Component Value Date   HGBA1C 12.7 (H) 04/27/2019   HGBA1C 13.5 (H) 09/26/2018   HGBA1C 12.0 (A) 06/27/2018   Lab Results  Component Value Date   MICROALBUR <0.7 09/26/2018   [place] [address] 09/30/2017   CREATININE 0.54 04/27/2019   Lab Results  Component Value Date   MICRALBCREAT 1.3 09/26/2018   Lab Results  Component Value Date   FRUCTOSAMINE 430 (H) 04/27/2019   FRUCTOSAMINE 455 (H) 06/19/2018   FRUCTOSAMINE 418 (H) 09/30/2017       Allergies as of 05/28/2019      Reactions   Ozempic [semaglutide] Diarrhea   Erythromycin Nausea And  Vomiting   Lisinopril Itching, Other (See Comments), Cough   Reaction:  Throat itching       Medication List       Accurate as of May 28, 2019  8:53 AM. If you have any questions, ask your nurse or doctor.        STOP taking these medications   insulin regular human CONCENTRATED 500 UNIT/ML kwikpen Commonly known as: HumuLIN R U-500 KwikPen Stopped by: Elayne Snare, MD     TAKE these medications   dapagliflozin propanediol 10 MG Tabs tablet Commonly known as: Farxiga Take 10 mg by mouth daily.   FreeStyle Libre 14 Day Sensor Misc 1 Units by Does not apply route every 14 (fourteen) days.   gabapentin 300 MG capsule Commonly known as: NEURONTIN Take 1 capsule (300 mg total) by mouth 3 (three) times daily.   glucose blood test strip Commonly known as: Contour Next Test 1 each by Other route as needed for other. Use as instructed to check blood sugar 1 time daily   insulin NPH-regular Human (70-30) 100 UNIT/ML injection Inject 70 units under the skin three times daily before meals.   Insulin Pen Needle 31G X 5 MM Misc Commonly known as: B-D UF III MINI PEN NEEDLES USE WITH INSULIN PEN 3 TIMES A DAY   lidocaine 5 % Commonly known as: Lidoderm Place 1 patch onto the skin daily. Remove & Discard patch within 12 hours or as directed by MD   potassium chloride 20 MEQ/15ML (10%) Soln TAKE 15 MLS (20 MEQ TOTAL) BY MOUTH DAILY.   simvastatin 40 MG tablet Commonly known as: ZOCOR Take 40 mg by mouth at bedtime.   valsartan 160 MG tablet Commonly known as: DIOVAN Take 160 mg by mouth daily. Take 1 tablet by mouth once daily.       Allergies:  Allergies  Allergen Reactions  . Ozempic [Semaglutide] Diarrhea  . Erythromycin Nausea And Vomiting  . Lisinopril Itching, Other (See Comments) and Cough    Reaction:  Throat itching     Past Medical History:  Diagnosis Date  . Diabetes mellitus without complication ([place])   . Hyperlipidemia   . Hypertension   .  Neuromuscular disorder ([place])   . Neuropathy     Past Surgical History:  Procedure Laterality Date  . TUBAL LIGATION  2003    Family History  Problem Relation Age of Onset  . Hypertension Mother   . Heart disease Mother   . Hypertension Father   . Hypertension Sister   . Hypertension Sister   . Diabetes Maternal Grandfather   . Diabetes Paternal Grandfather     Social History:  reports that she has never smoked. She has never used smokeless tobacco. She reports that she does not drink alcohol or use drugs.   Review of Systems   Lipid history: On simvastatin 40 mg for several years from her PCP Last lipid panel shows increased lipids Again she does not take this regularly She thinks it causes pain in the right upper chest or arm area but not any generalized pains     Lab Results  Component Value Date   CHOL 212 (H) 09/26/2018   HDL 53.80 09/26/2018   LDLCALC 95 09/30/2017   LDLDIRECT 107.0 04/27/2019   TRIG 265.0 (H) 09/26/2018   CHOLHDL 4 09/26/2018           Hypertension: Treated by PCP, on valsartan from her PCP  She does not check her blood pressure at home  BP Readings from Last 3 Encounters:  05/28/19 130/80  01/28/19 (!) 157/93  09/28/18 (!) 144/88     She has eye exams regularly Has proliferative retinopathy No reports available  Most recent foot exam: 12/19  Complications of diabetes: Has symptoms of neuropathy, having symptoms of burning and numbness , usually getting relief on gabapentin at bedtime    Physical Examination:  BP 130/80 (BP Location: Left Arm, Patient Position: Sitting, Cuff Size: Large)   Pulse 97   Ht 5\' 3"  (1.6 m)   Wt 194 lb 12.8 oz (88.4 kg)   LMP  (LMP Unknown)   SpO2 97%   BMI 34.51 kg/m      ASSESSMENT:  Diabetes type 2, insulin resistant with complications, BMI 31  See history of present illness for detailed discussion of current diabetes management, blood sugar patterns and problems identified  A1c is  still significantly high at 12.7  She has finally started using the freestyle libre However she has totally inconsistent blood sugar patterns Likely that she has had poor diet in the last week causing blood sugars to be well over 300 at times In the prior week her blood sugars have been on some days fairly close to and stable She is concerned about her weight gain also which has been significant Also does not appear to have any worsening of control with switching to 70/30 insulin compared to the Humulin R U-500  Because of lack of insurance for medication she is not able to get  HYPERTENSION: Blood pressure is improved currently  History of hypercholesterolemia: Discussed need to take simvastatin regularly and unlikely that she is having arm pain from this She will at least try to take this every other day   PLAN:   She will continue using the freestyle libre and can do it once a month since it is expensive for her  May continue 70/30 insulin  However if her sugars are relatively lower at dinnertime she can reduce the dose by 10 units otherwise continue 70 units 3 times daily  Consistent diet  Have low fat meals and avoid snacks  To have some protein at dinnertime daily  She can use an adhesive bandage to keep her freestyle libre on her arm and use the inner part of the arm  We will try  to get her on a patient assistance program for Farxiga  Regular follow-up   Recommended influenza vaccine but she wants to do it at the drugstore   There are no Patient Instructions on file for this visit.       Reather LittlerAjay Mariesha Venturella 05/28/2019, 8:53 AM   Note: This office note was prepared with Dragon voice recognition system technology. Any transcriptional errors that result from this process are unintentional.

## 2019-05-28 ENCOUNTER — Ambulatory Visit (INDEPENDENT_AMBULATORY_CARE_PROVIDER_SITE_OTHER): Payer: Self-pay | Admitting: Endocrinology

## 2019-05-28 ENCOUNTER — Other Ambulatory Visit: Payer: Self-pay

## 2019-05-28 ENCOUNTER — Encounter: Payer: Self-pay | Admitting: Endocrinology

## 2019-05-28 VITALS — BP 130/80 | HR 97 | Ht 63.0 in | Wt 194.8 lb

## 2019-05-28 DIAGNOSIS — E782 Mixed hyperlipidemia: Secondary | ICD-10-CM

## 2019-05-28 DIAGNOSIS — Z794 Long term (current) use of insulin: Secondary | ICD-10-CM

## 2019-05-28 DIAGNOSIS — E1165 Type 2 diabetes mellitus with hyperglycemia: Secondary | ICD-10-CM

## 2019-05-28 NOTE — Patient Instructions (Signed)
Take Simvastatin daily  Take 60 U in pm if sugar < 140  Protein at dinner daily  Exercise

## 2019-06-07 NOTE — Telephone Encounter (Signed)
After faxing PAP application to [facility], a fax was received stating that the patient does meet the program eligibility requirements and is enrolled for a 12 month period. Called pt and left detailed voicemail infoming her of this.

## 2019-06-22 IMAGING — CR DG CHEST 2V
2 series · 2 of 2 positions shown · non-contrast
Comparison: None.

CLINICAL DATA: Positive PPD.

EXAM:
CHEST - 2 VIEW

[w chest pa *]
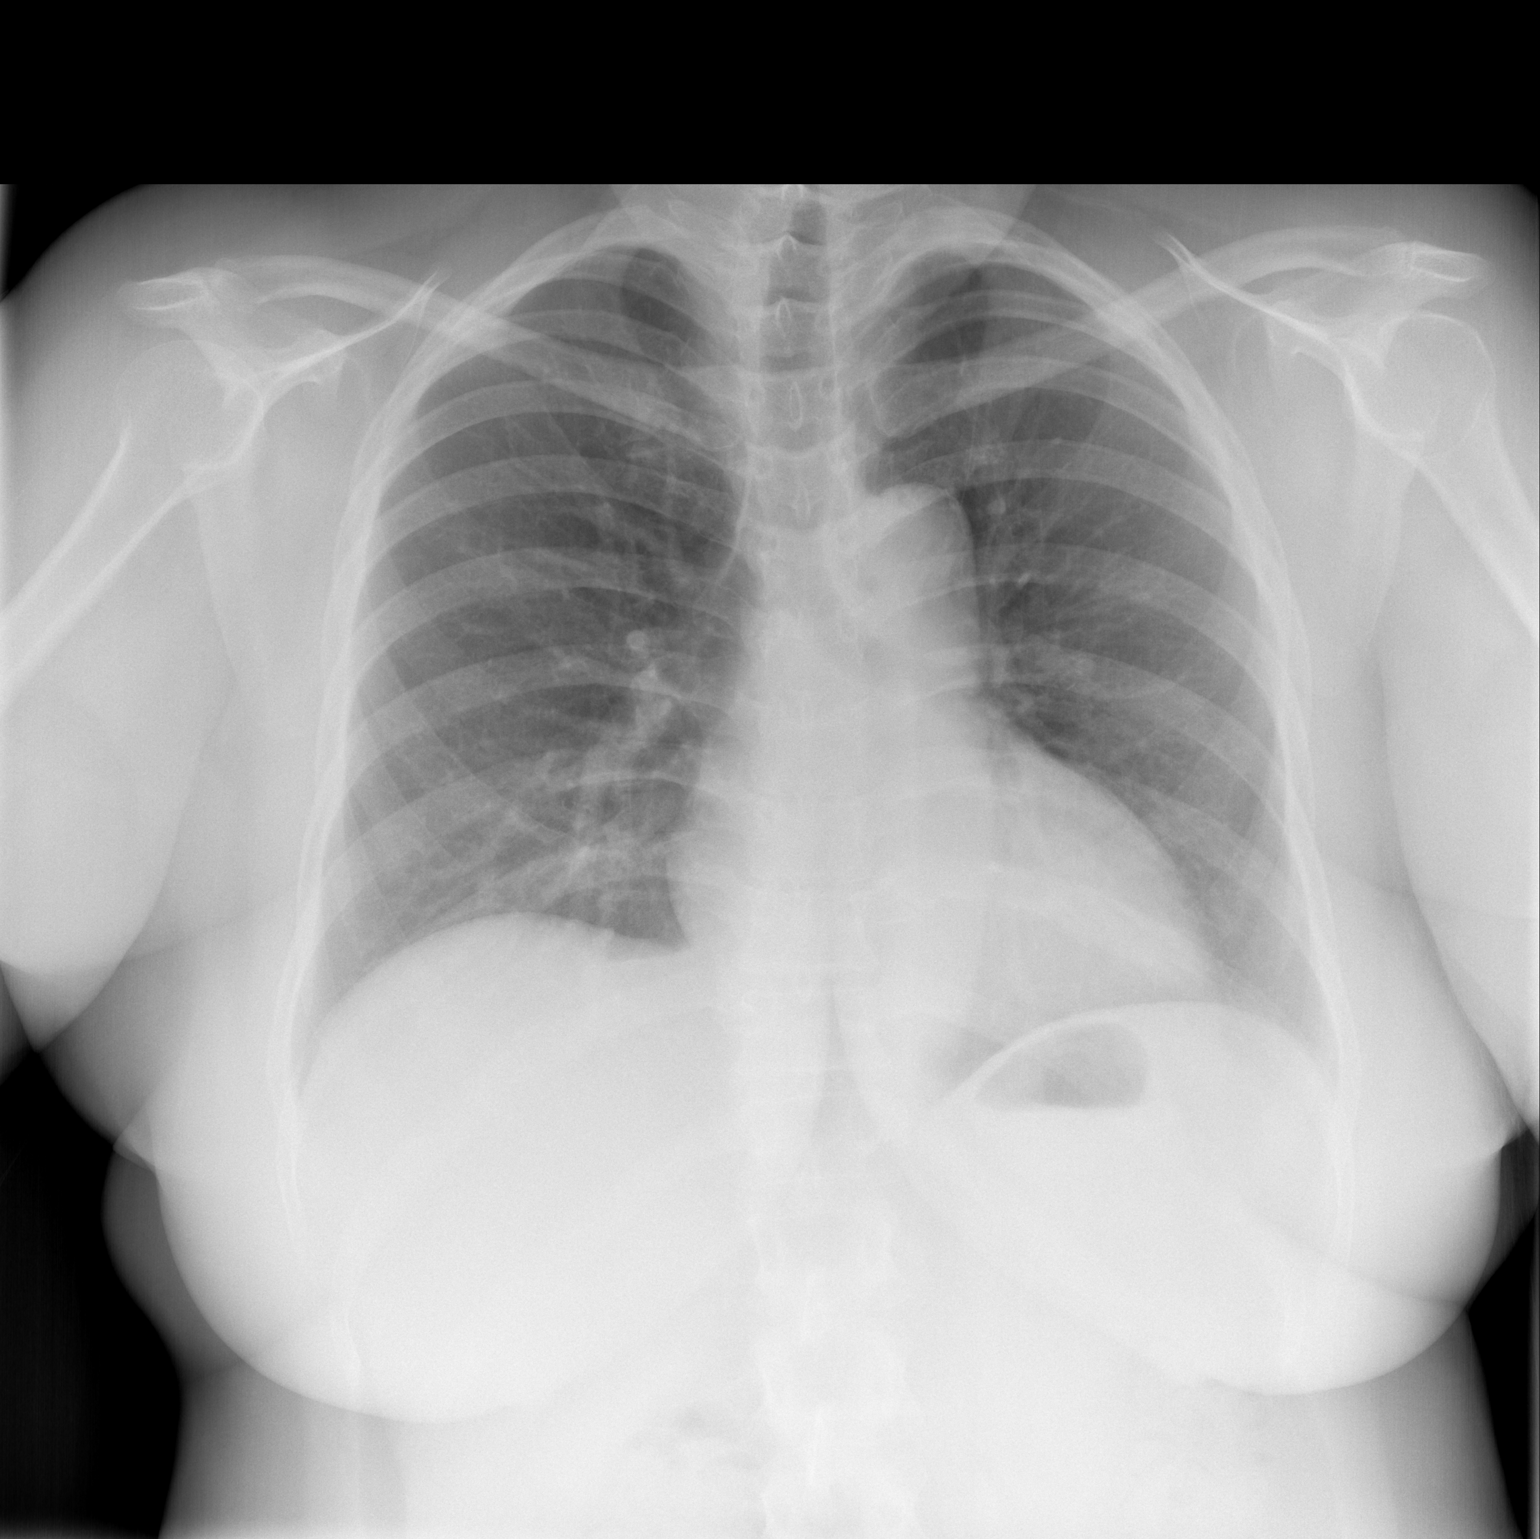

[w chest lat *]
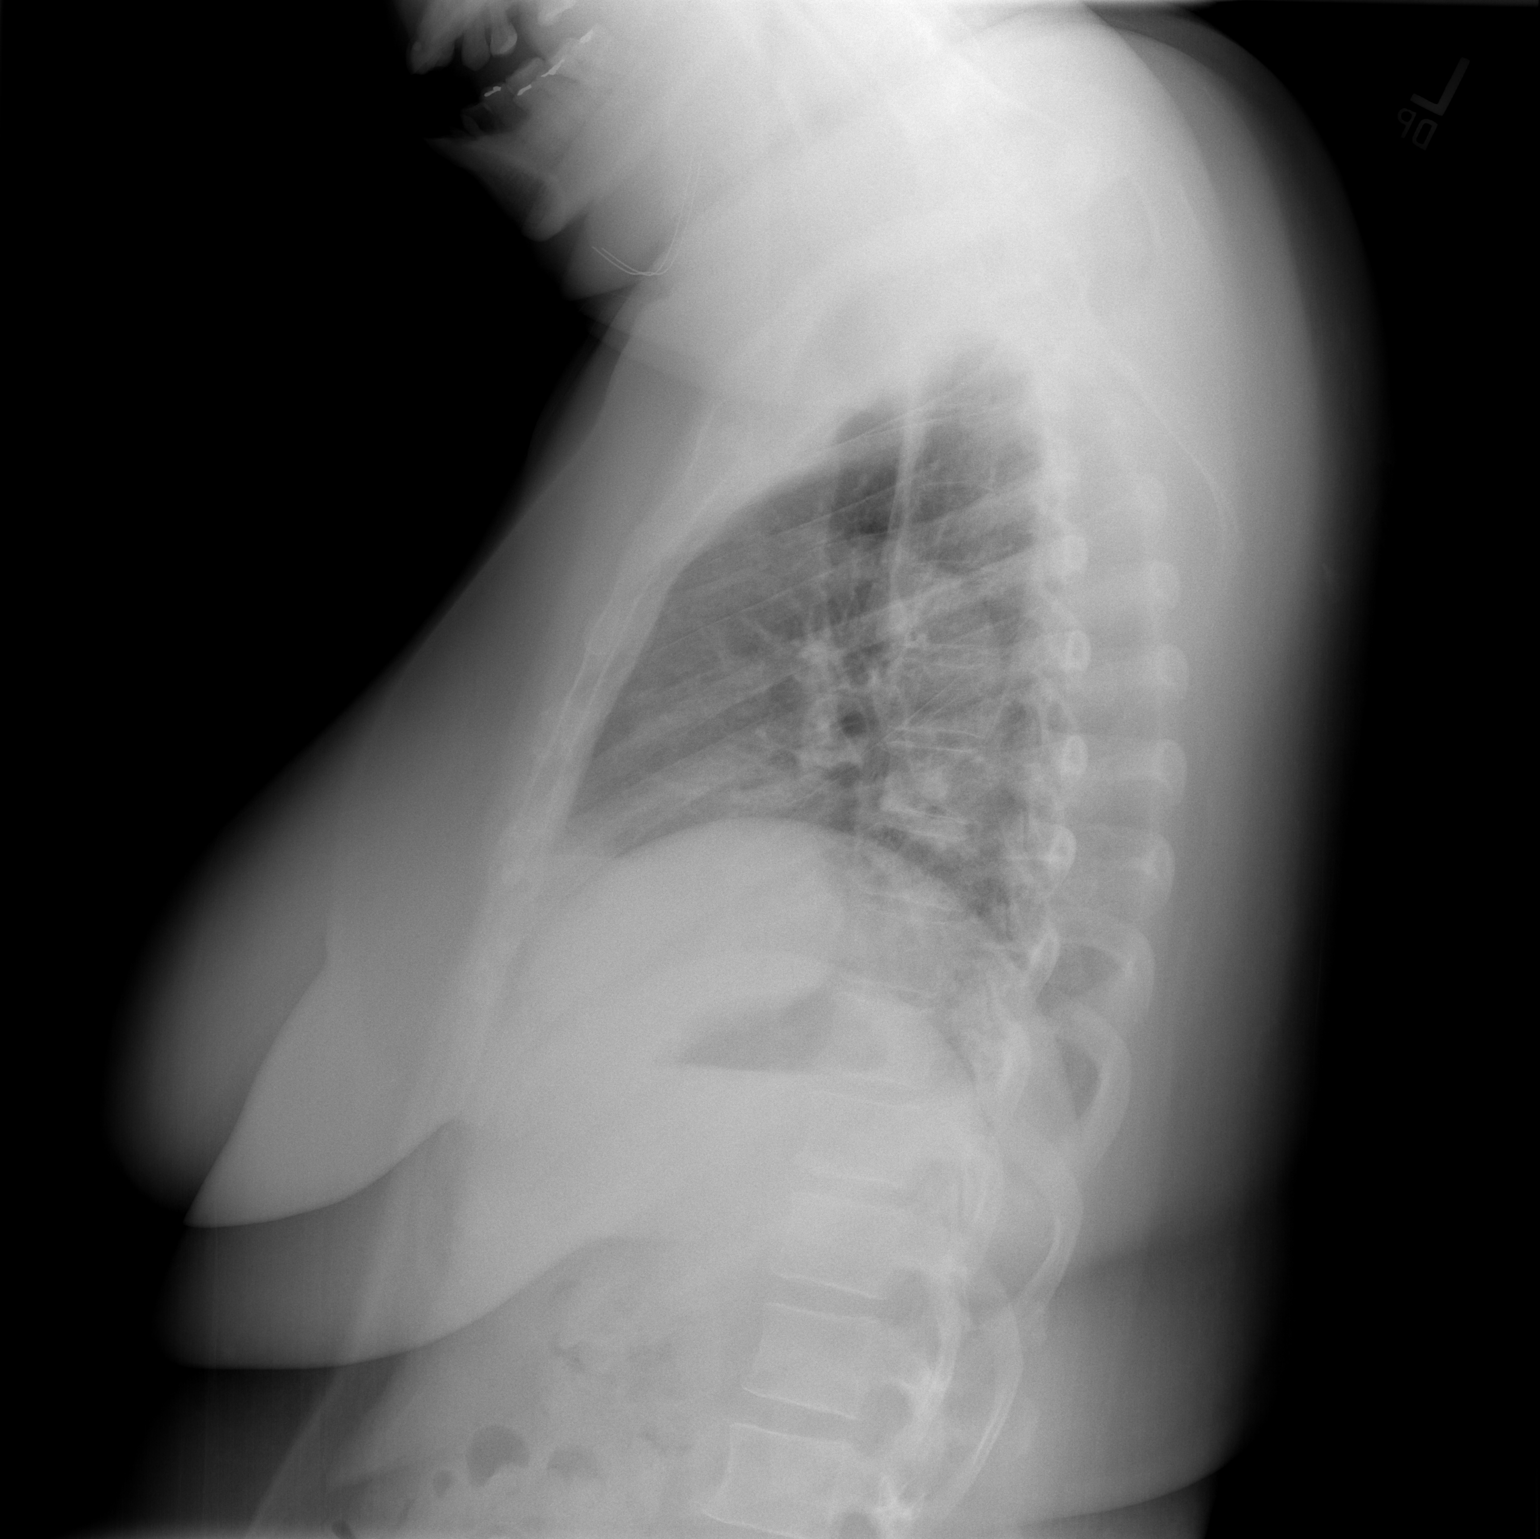

[2 of 2 positions shown; findings below may reference images not displayed]

FINDINGS: Lungs are adequately inflated and otherwise clear. Cardiomediastinal
silhouette is normal. Bones and soft tissues are normal.
IMPRESSION: No active cardiopulmonary disease.

## 2019-07-02 ENCOUNTER — Other Ambulatory Visit: Payer: Self-pay | Admitting: Endocrinology

## 2019-08-27 ENCOUNTER — Other Ambulatory Visit: Payer: Self-pay

## 2019-09-03 ENCOUNTER — Ambulatory Visit: Payer: Self-pay | Admitting: Endocrinology

## 2019-09-07 ENCOUNTER — Other Ambulatory Visit: Payer: Self-pay

## 2019-09-11 ENCOUNTER — Ambulatory Visit: Payer: Self-pay | Admitting: Endocrinology

## 2019-10-08 ENCOUNTER — Other Ambulatory Visit: Payer: Self-pay

## 2019-10-12 ENCOUNTER — Telehealth: Payer: Self-pay | Admitting: Endocrinology

## 2019-11-12 ENCOUNTER — Other Ambulatory Visit: Payer: Self-pay

## 2019-11-13 ENCOUNTER — Other Ambulatory Visit: Payer: Self-pay | Admitting: Endocrinology

## 2019-11-16 ENCOUNTER — Telehealth: Payer: Self-pay | Admitting: Endocrinology

## 2020-03-05 ENCOUNTER — Other Ambulatory Visit: Payer: Self-pay | Admitting: Family Medicine

## 2020-03-05 DIAGNOSIS — R7989 Other specified abnormal findings of blood chemistry: Secondary | ICD-10-CM

## 2020-04-08 ENCOUNTER — Ambulatory Visit
Admission: RE | Admit: 2020-04-08 | Discharge: 2020-04-08 | Disposition: A | Discharge status: Home / Self Care | Payer: Self-pay | Source: Ambulatory Visit | Attending: Family Medicine | Admitting: Family Medicine

## 2020-04-08 DIAGNOSIS — R7989 Other specified abnormal findings of blood chemistry: Secondary | ICD-10-CM

## 2020-06-07 ENCOUNTER — Other Ambulatory Visit: Payer: Self-pay | Admitting: Endocrinology

## 2020-06-07 DIAGNOSIS — Z794 Long term (current) use of insulin: Secondary | ICD-10-CM

## 2020-06-07 DIAGNOSIS — E1165 Type 2 diabetes mellitus with hyperglycemia: Secondary | ICD-10-CM

## 2020-06-07 DIAGNOSIS — E782 Mixed hyperlipidemia: Secondary | ICD-10-CM

## 2020-06-09 ENCOUNTER — Other Ambulatory Visit: Payer: Self-pay

## 2020-06-11 ENCOUNTER — Ambulatory Visit: Payer: Self-pay | Admitting: Endocrinology

## 2020-07-01 ENCOUNTER — Ambulatory Visit
Admission: EM | Admit: 2020-07-01 | Discharge: 2020-07-01 | Disposition: A | Discharge status: Home / Self Care | Payer: BC Managed Care – PPO | Attending: Emergency Medicine | Admitting: Emergency Medicine

## 2020-07-01 ENCOUNTER — Other Ambulatory Visit: Payer: Self-pay

## 2020-07-01 DIAGNOSIS — J0100 Acute maxillary sinusitis, unspecified: Secondary | ICD-10-CM | POA: Diagnosis not present

## 2020-07-01 MED ORDER — CETIRIZINE HCL 10 MG PO CAPS: 10.0000 mg | ORAL_CAPSULE | Freq: Every day | ORAL | 0 refills | Status: DC

## 2020-07-01 MED ORDER — FLUTICASONE PROPIONATE 50 MCG/ACT NA SUSP: 1.0000 | Freq: Every day | NASAL | 0 refills | Status: AC

## 2020-07-01 MED ORDER — AMOXICILLIN-POT CLAVULANATE 400-57 MG/5ML PO SUSR: 800.0000 mg | Freq: Two times a day (BID) | ORAL | 0 refills | Status: AC

## 2020-07-01 NOTE — ED Triage Notes (Signed)
Pt c/o sinus drainage and sore throat for past 2wks, took benadryl and it went away. Now having sinus drainage, headache, sore throat, and lt ear pressure since last night.

## 2020-07-01 NOTE — ED Triage Notes (Signed)
States had a neg covid test on Friday, Saturday, and Sunday before working.

## 2020-07-01 NOTE — ED Provider Notes (Signed)
[name]    CSN: [id number] Arrival date & time: 07/01/20  1507      History   Chief Complaint Chief Complaint  Patient presents with  . Sinusitis    HPI Tammy Chan is a 55 y.o. female history of hypertension, hyperlipidemia, DM type II, presenting today for evaluation of sinus congestion and sore throat.  Symptoms began 2 weeks ago.  Initially using Benadryl with relief.  Symptoms have returned and reports a lot of pressure especially on the left side.  HPI  Past Medical History:  Diagnosis Date  . Diabetes mellitus without complication ([facility])   . Hyperlipidemia   . Hypertension   . Neuromuscular disorder ([facility])   . Neuropathy     Patient Active Problem List   Diagnosis Date Noted  . Uncontrolled type 2 diabetes mellitus with hyperglycemia, with long-term current use of insulin ([facility]) 08/05/2016  . Left genital labial abscess 02/06/2014    Past Surgical History:  Procedure Laterality Date  . TUBAL LIGATION  2003    OB History    Gravida  3   Para  3   Term      Preterm      AB      Living  3     SAB      IAB      Ectopic      Multiple      Live Births               Home Medications    Prior to Admission medications   Medication Sig Start Date End Date Taking? Authorizing Provider  amoxicillin-clavulanate (AUGMENTIN) 400-57 MG/5ML suspension Take 10 mLs (800 mg total) by mouth 2 (two) times daily for 7 days. 07/01/20 07/08/20 Yes Keymoni Mccaster C, PA-C  Cetirizine HCl 10 MG CAPS Take 1 capsule (10 mg total) by mouth daily for 10 days. 07/01/20 07/11/20 Yes Shatavia Santor C, PA-C  fluticasone (FLONASE) 50 MCG/ACT nasal spray Place 1-2 sprays into both nostrils daily. 07/01/20  Yes Elnita Surprenant C, PA-C  Continuous Blood Gluc Sensor (FREESTYLE LIBRE 14 DAY SENSOR) MISC USE AS DIRECTED EVERY 14 DAYS. 11/14/19   Reather Littler, MD  glucose blood (CONTOUR NEXT TEST) test strip 1 each by Other route as needed for other. Use as  instructed to check blood sugar 1 time daily 01/02/18   Reather Littler, MD  insulin NPH-regular Human (70-30) 100 UNIT/ML injection Inject 70 units under the skin three times daily before meals. 05/10/19   Reather Littler, MD  Insulin Pen Needle (B-D UF III MINI PEN NEEDLES) 31G X 5 MM MISC USE WITH INSULIN PEN 3 TIMES A DAY 07/02/19   Reather Littler, MD  potassium chloride 20 MEQ/15ML (10%) SOLN TAKE 15 MLS (20 MEQ TOTAL) BY MOUTH DAILY. 11/06/18   Reather Littler, MD  simvastatin (ZOCOR) 40 MG tablet Take 40 mg by mouth at bedtime.    [provider]  valsartan (DIOVAN) 160 MG tablet Take 160 mg by mouth daily. Take 1 tablet by mouth once daily.    [provider]    Family History Family History  Problem Relation Age of Onset  . Hypertension Mother   . Heart disease Mother   . Hypertension Father   . Hypertension Sister   . Hypertension Sister   . Diabetes Maternal Grandfather   . Diabetes Paternal Grandfather     Social History Social History   Tobacco Use  . Smoking status: Never Smoker  .  Smokeless tobacco: Never Used  Vaping Use  . Vaping Use: Never used  Substance Use Topics  . Alcohol use: No    Comment: socical  . Drug use: No     Allergies   Ozempic [semaglutide], Erythromycin, and Lisinopril   Review of Systems Review of Systems  Constitutional: Negative for activity change, appetite change, chills, fatigue and fever.  HENT: Positive for congestion, rhinorrhea, sinus pressure and sore throat. Negative for ear pain and trouble swallowing.   Eyes: Negative for discharge and redness.  Respiratory: Negative for cough, chest tightness and shortness of breath.   Cardiovascular: Negative for chest pain.  Gastrointestinal: Negative for abdominal pain, diarrhea, nausea and vomiting.  Musculoskeletal: Negative for myalgias.  Skin: Negative for rash.  Neurological: Negative for dizziness, light-headedness and headaches.     Physical Exam Triage Vital Signs ED  Triage Vitals [07/01/20 1746]  Enc Vitals Group     BP (!) 151/93     Pulse Rate (!) 49     Resp 18     Temp 98.6 F (37 C)     Temp Source Oral     SpO2 94 %     Weight      Height      Head Circumference      Peak Flow      Pain Score 6     Pain Loc      Pain Edu?      Excl. in GC?    No data found.  Updated Vital Signs BP (!) 151/93 (BP Location: Left Arm)   Pulse (!) 49   Temp 98.6 F (37 C) (Oral)   Resp 18   LMP  (LMP Unknown)   SpO2 94%   Visual Acuity Right Eye Distance:   Left Eye Distance:   Bilateral Distance:    Right Eye Near:   Left Eye Near:    Bilateral Near:     Physical Exam Vitals and nursing note reviewed.  Constitutional:      Appearance: She is well-developed and well-nourished.     Comments: No acute distress  HENT:     Head: Normocephalic and atraumatic.     Ears:     Comments: Bilateral ears without tenderness to palpation of external auricle, tragus and mastoid, EAC's without erythema or swelling, TM's with good bony landmarks and cone of light. Non erythematous.     Nose: Nose normal.     Mouth/Throat:     Comments: Oral mucosa pink and moist, no tonsillar enlargement or exudate. Posterior pharynx patent and nonerythematous, no uvula deviation or swelling. Normal phonation. Eyes:     Conjunctiva/sclera: Conjunctivae normal.  Cardiovascular:     Rate and Rhythm: Normal rate.  Pulmonary:     Effort: Pulmonary effort is normal. No respiratory distress.     Comments: Breathing comfortably at rest, CTABL, no wheezing, rales or other adventitious sounds auscultated Abdominal:     General: There is no distension.  Musculoskeletal:        General: Normal range of motion.     Cervical back: Neck supple.  Skin:    General: Skin is warm and dry.  Neurological:     Mental Status: She is alert and oriented to person, place, and time.  Psychiatric:        Mood and Affect: Mood and affect normal.      UC Treatments / Results   Labs (all labs ordered are listed, but only abnormal results are displayed) Labs  Reviewed - No data to display  EKG   Radiology No results found.  Procedures Procedures (including critical care time)  Medications Ordered in UC Medications - No data to display  Initial Impression / Assessment and Plan / UC Course  I have reviewed the triage vital signs and the nursing notes.  Pertinent labs & imaging results that were available during my care of the patient were reviewed by me and considered in my medical decision making (see chart for details).     Treating for sinusitis given 2 weeks of symptoms and double sickening, recommending continued symptomatic and supportive care of congestion and drainage as well with Flonase and Zyrtec, adding in Augmentin to cover sinusitis.  Discussed strict return precautions. Patient verbalized understanding and is agreeable with plan.  Final Clinical Impressions(s) / UC Diagnoses   Final diagnoses:  Acute non-recurrent maxillary sinusitis     Discharge Instructions     Begin Augmentin twice daily for the next week Daily cetirizine and Flonase nasal spray to further help with sinus congestion pressure and postnasal drainage Rest and fluids Follow-up if not improving or worsening    ED Prescriptions    Medication Sig Dispense Auth. Provider   fluticasone (FLONASE) 50 MCG/ACT nasal spray Place 1-2 sprays into both nostrils daily. 16 g Kayton Dunaj C, PA-C   amoxicillin-clavulanate (AUGMENTIN) 400-57 MG/5ML suspension Take 10 mLs (800 mg total) by mouth 2 (two) times daily for 7 days. 150 mL Tawnya Pujol C, PA-C   Cetirizine HCl 10 MG CAPS Take 1 capsule (10 mg total) by mouth daily for 10 days. 10 capsule Tenesha Garza, [place] C, PA-C     PDMP not reviewed this encounter.   Janith Lima, [place] 07/01/20 [phone]

## 2020-07-01 NOTE — Discharge Instructions (Signed)
Begin Augmentin twice daily for the next week Daily cetirizine and Flonase nasal spray to further help with sinus congestion pressure and postnasal drainage Rest and fluids Follow-up if not improving or worsening

## 2020-07-11 ENCOUNTER — Other Ambulatory Visit: Payer: Self-pay

## 2020-07-11 ENCOUNTER — Encounter: Payer: Self-pay | Admitting: Emergency Medicine

## 2020-07-11 ENCOUNTER — Ambulatory Visit
Admission: EM | Admit: 2020-07-11 | Discharge: 2020-07-11 | Disposition: A | Discharge status: Home / Self Care | Payer: BC Managed Care – PPO | Attending: Emergency Medicine | Admitting: Emergency Medicine

## 2020-07-11 DIAGNOSIS — J029 Acute pharyngitis, unspecified: Secondary | ICD-10-CM

## 2020-07-11 DIAGNOSIS — Z1152 Encounter for screening for COVID-19: Secondary | ICD-10-CM | POA: Diagnosis not present

## 2020-07-11 MED ORDER — SUCRALFATE 1 GM/10ML PO SUSP: 1.0000 g | Freq: Three times a day (TID) | ORAL | 0 refills | Status: AC

## 2020-07-11 NOTE — ED Triage Notes (Signed)
Pt said she has been having nasal drainage and sore throat  And left ear pain, was treated here with medication and still not going away

## 2020-07-11 NOTE — ED Provider Notes (Signed)
[name]    CSN: [id number] Arrival date & time: 07/11/20  1321      History   Chief Complaint Chief Complaint  Patient presents with  . Sore Throat  . Otalgia    Left   . Nasal Congestion    HPI Tammy Chan is a 55 y.o. female  With history as below presenting for persistent nasal drainage, sore throat, otalgia.  States she was given supportive care at previous visit on 07/01/2020: Please see his records, review by me at time of visit.  States sore throat has persisted, otherwise feeling improved.  Past Medical History:  Diagnosis Date  . Diabetes mellitus without complication (HCC)   . Hyperlipidemia   . Hypertension   . Neuromuscular disorder (HCC)   . Neuropathy     Patient Active Problem List   Diagnosis Date Noted  . Uncontrolled type 2 diabetes mellitus with hyperglycemia, with long-term current use of insulin (HCC) 08/05/2016  . Left genital labial abscess 02/06/2014    Past Surgical History:  Procedure Laterality Date  . TUBAL LIGATION  2003    OB History    Gravida  3   Para  3   Term      Preterm      AB      Living  3     SAB      IAB      Ectopic      Multiple      Live Births               Home Medications    Prior to Admission medications   Medication Sig Start Date End Date Taking? Authorizing Provider  sucralfate (CARAFATE) 1 GM/10ML suspension Take 10 mLs (1 g total) by mouth 4 (four) times daily -  with meals and at bedtime. 07/11/20  Yes Hall-Potvin, [place], PA-C  Cetirizine HCl 10 MG CAPS Take 1 capsule (10 mg total) by mouth daily for 10 days. 07/01/20 07/11/20  Wieters, Hallie C, PA-C  Continuous Blood Gluc Sensor (FREESTYLE LIBRE 14 DAY SENSOR) MISC USE AS DIRECTED EVERY 14 DAYS. 11/14/19   Reather Littler, MD  fluticasone (FLONASE) 50 MCG/ACT nasal spray Place 1-2 sprays into both nostrils daily. 07/01/20   Wieters, Hallie C, PA-C  glucose blood (CONTOUR NEXT TEST) test strip 1 each by Other route as  needed for other. Use as instructed to check blood sugar 1 time daily 01/02/18   Reather Littler, MD  insulin NPH-regular Human (70-30) 100 UNIT/ML injection Inject 70 units under the skin three times daily before meals. 05/10/19   Reather Littler, MD  Insulin Pen Needle (B-D UF III MINI PEN NEEDLES) 31G X 5 MM MISC USE WITH INSULIN PEN 3 TIMES A DAY 07/02/19   Reather Littler, MD  potassium chloride 20 MEQ/15ML (10%) SOLN TAKE 15 MLS (20 MEQ TOTAL) BY MOUTH DAILY. 11/06/18   Reather Littler, MD  simvastatin (ZOCOR) 40 MG tablet Take 40 mg by mouth at bedtime.    [provider]  valsartan (DIOVAN) 160 MG tablet Take 160 mg by mouth daily. Take 1 tablet by mouth once daily.    [provider]    Family History Family History  Problem Relation Age of Onset  . Hypertension Mother   . Heart disease Mother   . Hypertension Father   . Hypertension Sister   . Hypertension Sister   . Diabetes Maternal Grandfather   . Diabetes Paternal Grandfather     Social  History Social History   Tobacco Use  . Smoking status: Never Smoker  . Smokeless tobacco: Never Used  Vaping Use  . Vaping Use: Never used  Substance Use Topics  . Alcohol use: No    Comment: socical  . Drug use: No     Allergies   Ozempic [semaglutide], Erythromycin, and Lisinopril   Review of Systems Review of Systems  Constitutional: Negative for fatigue and fever.  HENT: Positive for congestion, ear pain and sore throat. Negative for dental problem, facial swelling, hearing loss, sinus pain, trouble swallowing and voice change.   Eyes: Negative for photophobia, pain and visual disturbance.  Respiratory: Negative for cough and shortness of breath.   Cardiovascular: Negative for chest pain and palpitations.  Gastrointestinal: Negative for diarrhea and vomiting.  Musculoskeletal: Negative for arthralgias and myalgias.  Neurological: Negative for dizziness and headaches.     Physical Exam Triage Vital Signs ED Triage  Vitals  Enc Vitals Group     BP 07/11/20 1353 120/81     Pulse Rate 07/11/20 1353 98     Resp 07/11/20 1353 18     Temp 07/11/20 1353 98.3 F (36.8 C)     Temp Source 07/11/20 1353 Oral     SpO2 --      Weight --      Height --      Head Circumference --      Peak Flow --      Pain Score 07/11/20 1346 7     Pain Loc --      Pain Edu? --      Excl. in GC? --    No data found.  Updated Vital Signs BP 120/81 (BP Location: Left Arm)   Pulse 98   Temp 98.3 F (36.8 C) (Oral)   Resp 18   LMP  (LMP Unknown)   Visual Acuity Right Eye Distance:   Left Eye Distance:   Bilateral Distance:    Right Eye Near:   Left Eye Near:    Bilateral Near:     Physical Exam Constitutional:      General: She is not in acute distress.    Appearance: She is not ill-appearing or diaphoretic.  HENT:     Head: Normocephalic and atraumatic.     Right Ear: Tympanic membrane and ear canal normal.     Left Ear: Tympanic membrane and ear canal normal.     Mouth/Throat:     Mouth: Mucous membranes are moist.     Pharynx: Oropharynx is clear. Uvula midline. No oropharyngeal exudate, posterior oropharyngeal erythema or uvula swelling.  Eyes:     General: No scleral icterus.    Conjunctiva/sclera: Conjunctivae normal.     Pupils: Pupils are equal, round, and reactive to light.  Neck:     Comments: Trachea midline, negative JVD Cardiovascular:     Rate and Rhythm: Normal rate and regular rhythm.     Heart sounds: No murmur heard. No gallop.   Pulmonary:     Effort: Pulmonary effort is normal. No respiratory distress.     Breath sounds: No wheezing, rhonchi or rales.  Musculoskeletal:     Cervical back: Neck supple. No tenderness.  Lymphadenopathy:     Cervical: No cervical adenopathy.  Skin:    Capillary Refill: Capillary refill takes less than 2 seconds.     Coloration: Skin is not jaundiced or pale.     Findings: No rash.  Neurological:     General: No focal deficit  present.      Mental Status: She is alert and oriented to person, place, and time.      UC Treatments / Results  Labs (all labs ordered are listed, but only abnormal results are displayed) Labs Reviewed  NOVEL CORONAVIRUS, NAA    EKG   Radiology No results found.  Procedures Procedures (including critical care time)  Medications Ordered in UC Medications - No data to display  Initial Impression / Assessment and Plan / UC Course  I have reviewed the triage vital signs and the nursing notes.  Pertinent labs & imaging results that were available during my care of the patient were reviewed by me and considered in my medical decision making (see chart for details).     Patient afebrile, nontoxic.  Covid PCR pending.  Patient to quarantine until results are back.  We will treat supportively as outlined below.  Return precautions discussed, patient verbalized understanding and is agreeable to plan. Final Clinical Impressions(s) / UC Diagnoses   Final diagnoses:  Encounter for screening for COVID-19  Sore throat   Discharge Instructions   None    ED Prescriptions    Medication Sig Dispense Auth. Provider   sucralfate (CARAFATE) 1 GM/10ML suspension Take 10 mLs (1 g total) by mouth 4 (four) times daily -  with meals and at bedtime. 420 mL Hall-Potvin, [place], PA-C     PDMP not reviewed this encounter.   Hall-Potvin, [place], [place] 07/11/20 1434

## 2020-07-15 LAB — NOVEL CORONAVIRUS, NAA: SARS-CoV-2, NAA: NOT DETECTED

## 2020-07-21 DIAGNOSIS — E113412 Type 2 diabetes mellitus with severe nonproliferative diabetic retinopathy with macular edema, left eye: Secondary | ICD-10-CM | POA: Diagnosis not present

## 2020-07-21 DIAGNOSIS — H3582 Retinal ischemia: Secondary | ICD-10-CM | POA: Diagnosis not present

## 2020-07-21 DIAGNOSIS — E113511 Type 2 diabetes mellitus with proliferative diabetic retinopathy with macular edema, right eye: Secondary | ICD-10-CM | POA: Diagnosis not present

## 2020-07-21 DIAGNOSIS — H4311 Vitreous hemorrhage, right eye: Secondary | ICD-10-CM | POA: Diagnosis not present

## 2021-02-16 ENCOUNTER — Ambulatory Visit
Admission: RE | Admit: 2021-02-16 | Discharge: 2021-02-16 | Disposition: A | Discharge status: Home / Self Care | Payer: BC Managed Care – PPO | Source: Ambulatory Visit | Attending: Family Medicine | Admitting: Family Medicine

## 2021-02-16 ENCOUNTER — Other Ambulatory Visit: Payer: Self-pay | Admitting: Family Medicine

## 2021-02-16 DIAGNOSIS — Z111 Encounter for screening for respiratory tuberculosis: Secondary | ICD-10-CM

## 2021-03-10 ENCOUNTER — Other Ambulatory Visit: Payer: Self-pay | Admitting: Endocrinology

## 2021-03-10 ENCOUNTER — Other Ambulatory Visit: Payer: Self-pay

## 2021-03-10 ENCOUNTER — Other Ambulatory Visit (INDEPENDENT_AMBULATORY_CARE_PROVIDER_SITE_OTHER): Payer: 59

## 2021-03-10 DIAGNOSIS — Z794 Long term (current) use of insulin: Secondary | ICD-10-CM

## 2021-03-10 DIAGNOSIS — E1165 Type 2 diabetes mellitus with hyperglycemia: Secondary | ICD-10-CM

## 2021-03-10 DIAGNOSIS — E782 Mixed hyperlipidemia: Secondary | ICD-10-CM

## 2021-03-10 LAB — COMPREHENSIVE METABOLIC PANEL
ALT: 12 U/L (ref 0–35)
AST: 14 U/L (ref 0–37)
Albumin: 4 g/dL (ref 3.5–5.2)
Alkaline Phosphatase: 136 U/L — ABNORMAL HIGH (ref 39–117)
BUN: 18 mg/dL (ref 6–23)
CO2: 27 mEq/L (ref 19–32)
Calcium: 9.2 mg/dL (ref 8.4–10.5)
Chloride: 103 mEq/L (ref 96–112)
Creatinine, Ser: 0.6 mg/dL (ref 0.40–1.20)
GFR: 99.45 mL/min (ref >60.00)
Glucose, Bld: 150 mg/dL — ABNORMAL HIGH (ref 70–99)
Potassium: 3.2 mEq/L — ABNORMAL LOW (ref 3.5–5.1)
Sodium: 138 mEq/L (ref 135–145)
Total Bilirubin: 0.3 mg/dL (ref 0.2–1.2)
Total Protein: 7.4 g/dL (ref 6.0–8.3)

## 2021-03-10 LAB — LIPID PANEL
Cholesterol: 199 mg/dL (ref 0–200)
HDL: 56.6 mg/dL (ref >39.00)
NonHDL: 142.78
Total CHOL/HDL Ratio: 4
Triglycerides: 207 mg/dL — ABNORMAL HIGH (ref 0.0–149.0)
VLDL: 41.4 mg/dL — ABNORMAL HIGH (ref 0.0–40.0)

## 2021-03-10 LAB — MICROALBUMIN / CREATININE URINE RATIO
Creatinine,U: 255.7 mg/dL
Microalb Creat Ratio: 2.7 mg/g (ref 0.0–30.0)
Microalb, Ur: 6.9 mg/dL — ABNORMAL HIGH (ref 0.0–1.9)

## 2021-03-10 LAB — LDL CHOLESTEROL, DIRECT: Direct LDL: 126 mg/dL

## 2021-03-10 LAB — HEMOGLOBIN A1C: Hgb A1c MFr Bld: 12.9 % — ABNORMAL HIGH (ref 4.6–6.5)

## 2021-03-18 ENCOUNTER — Ambulatory Visit: Payer: Self-pay | Admitting: Endocrinology

## 2021-04-24 ENCOUNTER — Other Ambulatory Visit (INDEPENDENT_AMBULATORY_CARE_PROVIDER_SITE_OTHER): Payer: 59

## 2021-04-24 ENCOUNTER — Other Ambulatory Visit: Payer: Self-pay | Admitting: Endocrinology

## 2021-04-24 ENCOUNTER — Other Ambulatory Visit: Payer: Self-pay

## 2021-04-24 DIAGNOSIS — Z794 Long term (current) use of insulin: Secondary | ICD-10-CM

## 2021-04-24 DIAGNOSIS — E1165 Type 2 diabetes mellitus with hyperglycemia: Secondary | ICD-10-CM

## 2021-04-24 LAB — GLUCOSE, RANDOM: Glucose, Bld: 394 mg/dL — ABNORMAL HIGH (ref 70–99)

## 2021-04-25 LAB — FRUCTOSAMINE: Fructosamine: 525 umol/L — ABNORMAL HIGH (ref 0–285)

## 2021-04-28 ENCOUNTER — Other Ambulatory Visit: Payer: Self-pay

## 2021-04-28 ENCOUNTER — Ambulatory Visit (INDEPENDENT_AMBULATORY_CARE_PROVIDER_SITE_OTHER): Payer: 59 | Admitting: Endocrinology

## 2021-04-28 ENCOUNTER — Encounter: Payer: Self-pay | Admitting: Endocrinology

## 2021-04-28 VITALS — BP 152/98 | HR 92 | Ht 62.75 in | Wt 182.2 lb

## 2021-04-28 DIAGNOSIS — E782 Mixed hyperlipidemia: Secondary | ICD-10-CM

## 2021-04-28 DIAGNOSIS — I10 Essential (primary) hypertension: Secondary | ICD-10-CM

## 2021-04-28 DIAGNOSIS — Z794 Long term (current) use of insulin: Secondary | ICD-10-CM

## 2021-04-28 DIAGNOSIS — E1165 Type 2 diabetes mellitus with hyperglycemia: Secondary | ICD-10-CM

## 2021-04-28 MED ORDER — FREESTYLE LIBRE 3 SENSOR MISC: 1.0000 | 2 refills | Status: DC

## 2021-04-28 MED ORDER — EMPAGLIFLOZIN 10 MG PO TABS: 10.0000 mg | ORAL_TABLET | Freq: Every day | ORAL | 1 refills | Status: DC

## 2021-04-28 MED ORDER — ROSUVASTATIN CALCIUM 5 MG PO TABS: 5.0000 mg | ORAL_TABLET | Freq: Every day | ORAL | 3 refills | Status: DC

## 2021-04-28 NOTE — Progress Notes (Signed)
Patient ID: Tammy Chan, female   DOB: [birth year], 55 y.o.   MRN: [id number]          Reason for Appointment:  follow-up  for Type 2 Diabetes   History of Present Illness:          Date of diagnosis of type 2 diabetes mellitus: 1986       Background history:   She thinks she was started on insulin at time of diagnosis, this was at the age of about 26 She also had tried some oral medications which apparently did not work including metformin Over the years she has taken various insulin regimens including premixed insulin, Lantus and NovoLog Detailed records are not available and not clear what her level of control has been A1c in 2014 and 2015 at [facility] was around 14  Recent history:   INSULIN regimen is:   Humulin R U-500 insulin: 100 units TID  A1c is still high at 12.9  She has not been seen in follow-up since 04/2019   Non-insulin hypoglycemic drugs the patient is taking are: None  Current management, blood sugar patterns and problems identified: She is not using the freestyle libre recently because of some technical issues and she could not get a reading  She says she has had issues with visual loss, COVID infections and other intercurrent problems and has not been back for follow-up  She has however been able to get back on the Humulin R insulin  Although she has increased her dose to 100 units 3 times a day she is not getting any better control  She has not been working and is eating 3 meals a day with an early supper now  HIGHEST blood sugars appear to be in the mornings although she may still have some high readings at lunch or dinner, lowest reading appear to be at bedtime  She will not eat as much at dinnertime  She thinks she is trying to check her sugars 4 times a day but did not bring her monitor for download  Not able to do any exercise Weight has fluctuated significantly       Side effects from medications have been: Diarrhea from Ozempic, nausea from  metformin, difficulty swallowing large tablets like metformin  Mealtimes:  lunch around 4 PM and dinner at 7 OR  11PM  Glucose monitoring:  done 0-1 times a day         Glucometer:     PRE-MEAL Fasting Lunch Dinner Bedtime Overall  Glucose range: 165-384 150-260 156-230 138-310   Mean/median:     ?   Previous data:  CGM use % of time  79  2-week average/SD  172+/-51, GV  Time in range        54%  % Time Above 180  18  % Time above 250  21  % Time Below 70  7     PRE-MEAL Fasting Lunch Dinner Bedtime Overall  Glucose range:       Averages:  165    135  172   POST-MEAL PC Breakfast PC Lunch PC Dinner  Glucose range:     Averages:  187  191  176     Self-care:  Meal times are:  Breakfast is 9 am Lunch: 11 pm Dinner: 6 pm  Typical meal intake: Breakfast is eggs, toast or cereal, Lunch Is a sandwich, evening meal is  chicken and some rice    Dietician visit, most recent: 8/19  CDE consultation:  2/18                  Weight history:  Wt Readings from Last 3 Encounters:  04/28/21 182 lb 3.2 oz (82.6 kg)  05/28/19 194 lb 12.8 oz (88.4 kg)  01/28/19 172 lb (78 kg)    Glycemic control: A1c in 1/18 was 15.5    Lab Results  Component Value Date   HGBA1C 12.9 (H) 03/10/2021   HGBA1C 12.7 (H) 04/27/2019   HGBA1C 13.5 (H) 09/26/2018   Lab Results  Component Value Date   MICROALBUR 6.9 (H) 03/10/2021   LDLCALC 95 09/30/2017   CREATININE 0.60 03/10/2021   Lab Results  Component Value Date   MICRALBCREAT 2.7 03/10/2021   Lab Results  Component Value Date   FRUCTOSAMINE 525 (H) 04/24/2021   FRUCTOSAMINE 430 (H) 04/27/2019   FRUCTOSAMINE 455 (H) 06/19/2018       Allergies as of 04/28/2021       Reactions   Ozempic [semaglutide] Diarrhea   Erythromycin Nausea And Vomiting   Lisinopril Itching, Other (See Comments), Cough   Reaction:  Throat itching         Medication List        Accurate as of April 28, 2021  9:47 AM. If you have any questions,  ask your nurse or doctor.          STOP taking these medications    insulin NPH-regular Human (70-30) 100 UNIT/ML injection Stopped by: Reather Littler, MD   potassium chloride 20 MEQ/15ML (10%) Soln Stopped by: Reather Littler, MD   simvastatin 40 MG tablet Commonly known as: ZOCOR Stopped by: Reather Littler, MD   valsartan 160 MG tablet Commonly known as: DIOVAN Stopped by: Reather Littler, MD       TAKE these medications    B-D UF III MINI PEN NEEDLES 31G X 5 MM Misc Generic drug: Insulin Pen Needle USE WITH INSULIN PEN 3 TIMES A DAY   Cetirizine HCl 10 MG Caps Take 1 capsule (10 mg total) by mouth daily for 10 days.   empagliflozin 10 MG Tabs tablet Commonly known as: Jardiance Take 1 tablet (10 mg total) by mouth daily with breakfast. Started by: Reather Littler, MD   fluticasone 50 MCG/ACT nasal spray Commonly known as: FLONASE Place 1-2 sprays into both nostrils daily.   FreeStyle Libre 14 Day Sensor Misc USE AS DIRECTED EVERY 14 DAYS.   glucose blood test strip Commonly known as: Contour Next Test 1 each by Other route as needed for other. Use as instructed to check blood sugar 1 time daily   insulin regular human CONCENTRATED 500 UNIT/ML injection Commonly known as: HUMULIN R Inject into the skin.   losartan 100 MG tablet Commonly known as: COZAAR Take by mouth.   pregabalin 25 MG capsule Commonly known as: LYRICA Take by mouth.   rosuvastatin 5 MG tablet Commonly known as: Crestor Take 1 tablet (5 mg total) by mouth daily. Started by: Reather Littler, MD   sucralfate 1 GM/10ML suspension Commonly known as: Carafate Take 10 mLs (1 g total) by mouth 4 (four) times daily -  with meals and at bedtime.        Allergies:  Allergies  Allergen Reactions   Ozempic [Semaglutide] Diarrhea   Erythromycin Nausea And Vomiting   Lisinopril Itching, Other (See Comments) and Cough    Reaction:  Throat itching     Past Medical History:  Diagnosis Date   Diabetes  mellitus without complication (HCC)  Hyperlipidemia    Hypertension    Neuromuscular disorder (HCC)    Neuropathy     Past Surgical History:  Procedure Laterality Date   TUBAL LIGATION  2003    Family History  Problem Relation Age of Onset   Hypertension Mother    Heart disease Mother    Hypertension Father    Hypertension Sister    Hypertension Sister    Diabetes Maternal Grandfather    Diabetes Paternal Grandfather     Social History:  reports that she has never smoked. She has never used smokeless tobacco. She reports that she does not drink alcohol and does not use drugs.   Review of Systems   Lipid history: Prescribed simvastatin 40 mg for several years from her PCP Last lipid panel shows increased lipids Again she does not take this especially recently She thinks it causes pain in the right upper chest or arm area but not any generalized pains     Lab Results  Component Value Date   CHOL 199 03/10/2021   HDL 56.60 03/10/2021   LDLCALC 95 09/30/2017   LDLDIRECT 126.0 03/10/2021   TRIG 207.0 (H) 03/10/2021   CHOLHDL 4 03/10/2021           Hypertension: Treated by PCP, on losartan from her PCP  Blood pressure appears to be poorly controlled  BP Readings from Last 3 Encounters:  04/28/21 (!) 152/98  07/11/20 120/81  07/01/20 (!) 151/93    Retinopathy She has eye exams regularly Has proliferative retinopathy and recent vitrectomy : No reports available  Most recent foot exam: 9/22  Complications of diabetes: Has symptoms of neuropathy, having symptoms of burning and numbness, radiculopathy    Physical Examination:  BP (!) 152/98   Pulse 92   Ht 5' 2.75" (1.594 m)   Wt 182 lb 3.2 oz (82.6 kg)   LMP  (LMP Unknown)   SpO2 98%   BMI 32.53 kg/m      ASSESSMENT:  Diabetes type 2, insulin resistant with complications, BMI 31  See history of present illness for detailed discussion of current diabetes management, blood sugar patterns and  problems identified  A1c is still significantly high at 12.9  She has inadequate control despite using Humulin R currently She does have some variability in her blood sugars but high readings in the mornings not controlled with the Humulin R taking around 6 PM in the evening Previously had not been able to get [place] because of insurance issues  Because of her persistently poorly controlled may be a good candidate for an insulin pump device such as a V-go pump This may be also very useful since she is going back to work with night shifts tomorrow  HYPERTENSION: Blood pressure is poorly controlled currently, she will continue to follow-up with her PCP  History of hypercholesterolemia: Since she has some intolerance to simvastatin will need to change to help reduce cardiovascular risk and better tolerability   PLAN:  She will restart using the freestyle libre and prescription will be sent for the [place] 3  Discussed using her smart phone for reading the blood sugars and she will let us know if she needs any help  Insulin instructions:  Off days: insulin 110 u before Bfst, 110 before lunch, 90 at dinner and 35 U at bedtime  Work days: 110 before nite meal, 120 units before Bfst and 35 when waking up Discussed in detail the use of a V-go pump device, which will also be used  with the U-500 insulin and will verify insurance coverage and start when available Check BP at work and if consistently high will adjust medications Regular follow-up     Patient Instructions  Off days: insulin 110 u before Bfst, 110 before lunch, 90 at dinner and 35 U at bedtime  Work days: 110 before nite meal, 120 units before Bfst and 35 when waking up  Check BP at work        [employer] 04/28/2021, 9:47 AM   Note: This office note was prepared with Dragon voice recognition system technology. Any transcriptional errors that result from this process are unintentional.

## 2021-04-28 NOTE — Patient Instructions (Addendum)
Off days: insulin 110 u before Bfst, 110 before lunch, 90 at dinner and 35 U at bedtime  Work days: 110 before nite meal, 120 units before Bfst and 35 when waking up  Check BP at work

## 2021-05-14 ENCOUNTER — Telehealth: Payer: Self-pay | Admitting: Endocrinology

## 2021-05-14 NOTE — Telephone Encounter (Signed)
Patient called re: [employer] stated to Patient they have not received [employer] forms that were faxed on 04/29/21.  Patient requests to fax [employer] forms to Fax# [phone]

## 2021-05-14 NOTE — Telephone Encounter (Signed)
Sent to fax number below at patients request

## 2021-06-15 ENCOUNTER — Other Ambulatory Visit: Payer: 59

## 2021-06-18 ENCOUNTER — Ambulatory Visit: Payer: 59 | Admitting: Endocrinology

## 2021-07-16 ENCOUNTER — Other Ambulatory Visit: Payer: Self-pay | Admitting: Endocrinology

## 2021-07-19 ENCOUNTER — Other Ambulatory Visit: Payer: Self-pay | Admitting: Endocrinology

## 2021-07-20 ENCOUNTER — Other Ambulatory Visit: Payer: Self-pay

## 2021-07-21 ENCOUNTER — Other Ambulatory Visit (INDEPENDENT_AMBULATORY_CARE_PROVIDER_SITE_OTHER): Payer: Self-pay

## 2021-07-21 ENCOUNTER — Other Ambulatory Visit: Payer: Self-pay

## 2021-07-21 DIAGNOSIS — E1165 Type 2 diabetes mellitus with hyperglycemia: Secondary | ICD-10-CM

## 2021-07-21 DIAGNOSIS — Z794 Long term (current) use of insulin: Secondary | ICD-10-CM

## 2021-07-21 DIAGNOSIS — E782 Mixed hyperlipidemia: Secondary | ICD-10-CM

## 2021-07-22 LAB — COMPREHENSIVE METABOLIC PANEL
ALT: 34 U/L (ref 0–35)
AST: 24 U/L (ref 0–37)
Albumin: 4.1 g/dL (ref 3.5–5.2)
Alkaline Phosphatase: 145 U/L — ABNORMAL HIGH (ref 39–117)
BUN: 19 mg/dL (ref 6–23)
CO2: 31 mEq/L (ref 19–32)
Calcium: 9.7 mg/dL (ref 8.4–10.5)
Chloride: 105 mEq/L (ref 96–112)
Creatinine, Ser: 0.61 mg/dL (ref 0.40–1.20)
GFR: 98.8 mL/min (ref >60.00)
Glucose, Bld: 76 mg/dL (ref 70–99)
Potassium: 4.2 mEq/L (ref 3.5–5.1)
Sodium: 143 mEq/L (ref 135–145)
Total Bilirubin: 0.3 mg/dL (ref 0.2–1.2)
Total Protein: 7.3 g/dL (ref 6.0–8.3)

## 2021-07-22 LAB — LDL CHOLESTEROL, DIRECT: Direct LDL: 128 mg/dL

## 2021-07-22 LAB — HEMOGLOBIN A1C: Hgb A1c MFr Bld: 12.8 % — ABNORMAL HIGH (ref 4.6–6.5)

## 2021-07-23 ENCOUNTER — Encounter: Payer: Self-pay | Admitting: Endocrinology

## 2021-07-23 ENCOUNTER — Ambulatory Visit (INDEPENDENT_AMBULATORY_CARE_PROVIDER_SITE_OTHER): Payer: Self-pay | Admitting: Endocrinology

## 2021-07-23 ENCOUNTER — Other Ambulatory Visit: Payer: Self-pay

## 2021-07-23 VITALS — BP 150/96 | HR 90 | Ht 62.75 in | Wt 187.6 lb

## 2021-07-23 DIAGNOSIS — E782 Mixed hyperlipidemia: Secondary | ICD-10-CM

## 2021-07-23 DIAGNOSIS — E1165 Type 2 diabetes mellitus with hyperglycemia: Secondary | ICD-10-CM

## 2021-07-23 DIAGNOSIS — I10 Essential (primary) hypertension: Secondary | ICD-10-CM

## 2021-07-23 DIAGNOSIS — Z794 Long term (current) use of insulin: Secondary | ICD-10-CM

## 2021-07-23 MED ORDER — ROSUVASTATIN CALCIUM 10 MG PO TABS: 10.0000 mg | ORAL_TABLET | Freq: Every day | ORAL | 1 refills | Status: DC

## 2021-07-23 MED ORDER — METOCLOPRAMIDE HCL 5 MG PO TABS: 5.0000 mg | ORAL_TABLET | Freq: Three times a day (TID) | ORAL | 1 refills | Status: DC

## 2021-07-23 MED ORDER — HYDROCHLOROTHIAZIDE 12.5 MG PO CAPS: 12.5000 mg | ORAL_CAPSULE | Freq: Every day | ORAL | 1 refills | Status: DC

## 2021-07-23 NOTE — Patient Instructions (Addendum)
Reglan 30 min before meals  Diflucan 1-2x per week   Take at least 80 U units before meal if sugar low  Reduce supper dose to 85 at supper  Extra 20 for sweets  Crestor 10mg    HCTZ for BP

## 2021-07-23 NOTE — Progress Notes (Signed)
Patient ID: [name], female   DOB: 05/04/1966, 56 y.o.   MRN: [id number]          Reason for Appointment:  follow-up  for Type 2 Diabetes   History of Present Illness:          Date of diagnosis of type 2 diabetes mellitus: 1986       Background history:   She thinks she was started on insulin at time of diagnosis, this was at the age of about [age] She also had tried some oral medications which apparently did not work including metformin Over the years she has taken various insulin regimens including premixed insulin, Lantus and NovoLog Detailed records are not available and not clear what her level of control has been [facility] 2014 [facility]  Recent history:   INSULIN regimen is:   Humulin R U-500 insulin: 100 units TID  A1c is still high at 12.8   Non-insulin hypoglycemic drugs the patient is taking are: Jardiance 10 mg daily  Current management, blood sugar patterns and problems identified: She is recently using the freestyle libre She thinks she is getting fairly accurate readings although unable to compare her lab glucose with the libre reading on the same day  Also was started on JARDIANCE on the last visit which she is taking  Even though her A1c is still about the same her blood sugars recently are averaging in the 160s only Lab glucose in the afternoon was 76  She is mostly at home during the daytime and not working night shifts except rarely and is using the same insulin routine as before  She do not increase her doses as directed for breakfast and lunch by 10 units and reduce it at dinnertime as given in writing on the last visit  Also if her blood sugars are low normal before breakfast She will not take her insulin until the blood sugar is significantly high  She is in the last few days starting to get nocturnal HYPOGLYCEMIA as seen on her sensor Also she thinks her blood sugar was 55 this morning around 3:30 AM on her meter  However  she will get some HYPERGLYCEMIA periodically at lunch and dinner based on whether she is eating desserts or drinking regular soft drinks   Current analysis of the blood sugar data from freestyle libre between 1/9 and 1/22 as follows  HYPERGLYCEMIA is most notable in the afternoons and periodically in the evenings or late at night Appears to have had significant hyperglycemia 3 to 4 days out of the last 8 days that she has data available Hyperglycemia episodes cause blood sugars to be generally over 300 and occasionally over 350 Blood sugar patterns are not consistent from day-to-day The last 2 nights at least she has had tendency to HYPOGLYCEMIA during the night anywhere between 1 AM up to 8 AM but also has had low sugar on 1/22 around 3-4 PM POSTPRANDIAL readings are variable with mostly hyperglycemia in the mid afternoon or evenings/late at night without consistent pattern Most of the hypoglycemia has been overnight Overnight blood sugars are averaging about 160 and decreasing progressively until about 6-7 AM with moderate variability       Side effects from medications have been: Diarrhea from Ozempic, nausea from metformin, difficulty swallowing large tablets like metformin  Mealtimes:  lunch around 4 PM and dinner at 7 OR  11PM  Data from the freestyle [place] for the last 2 weeks  as follows  CGM use % of time 56  2-week average/GV 164/44  Time in range      62%  % Time Above 180 23+10  % Time above 250   % Time Below 70 5     PRE-MEAL Fasting Lunch Dinner Bedtime Overall  Glucose range:       Averages: 121 154 172  164   POST-MEAL PC Breakfast PC Lunch PC Dinner  Glucose range:     Averages: 142 189 202       PRE-MEAL Fasting Lunch Dinner Bedtime Overall  Glucose range: 165-384 150-260 156-230 138-310   Mean/median:     ?   Previous data:  CGM use % of time  79  2-week average/SD  172+/-51, GV  Time in range        54%  % Time Above 180  18  % Time above 250  21   % Time Below 70  7     PRE-MEAL Fasting Lunch Dinner Bedtime Overall  Glucose range:       Averages:  165    135  172   POST-MEAL PC Breakfast PC Lunch PC Dinner  Glucose range:     Averages:  187  191  176     Self-care:  Meal times are:  Breakfast is 9 am Lunch: 11 pm Dinner: 6 pm  Typical meal intake: Breakfast is eggs, toast or cereal, Lunch Is a sandwich, evening meal is  chicken and some rice    Dietician visit, most recent: 8/19  CDE consultation: 2/18                  Weight history:  Wt Readings from Last 3 Encounters:  07/23/21 187 lb 9.6 oz (85.1 kg)  04/28/21 182 lb 3.2 oz (82.6 kg)  05/28/19 194 lb 12.8 oz (88.4 kg)    Glycemic control: A1c in 1/18 was 15.5    Lab Results  Component Value Date   HGBA1C 12.8 (H) 07/21/2021   HGBA1C 12.9 (H) 03/10/2021   HGBA1C 12.7 (H) 04/27/2019   Lab Results  Component Value Date   MICROALBUR 6.9 (H) 03/10/2021   LDLCALC 95 09/30/2017   CREATININE 0.61 07/21/2021   Lab Results  Component Value Date   MICRALBCREAT 2.7 03/10/2021   Lab Results  Component Value Date   FRUCTOSAMINE 525 (H) 04/24/2021   FRUCTOSAMINE 430 (H) 04/27/2019   FRUCTOSAMINE 455 (H) 06/19/2018       Allergies as of 07/23/2021       Reactions   Ozempic [semaglutide] Diarrhea   Erythromycin Nausea And Vomiting   Lisinopril Itching, Other (See Comments), Cough   Reaction:  Throat itching         Medication List        Accurate as of July 23, 2021 12:37 PM. If you have any questions, ask your nurse or doctor.          B-D UF III MINI PEN NEEDLES 31G X 5 MM Misc Generic drug: Insulin Pen Needle USE WITH INSULIN PEN 3 TIMES A DAY   Cetirizine HCl 10 MG Caps Take 1 capsule (10 mg total) by mouth daily for 10 days.   Cetirizine HCl 10 MG Caps Take by mouth.   fluticasone 50 MCG/ACT nasal spray Commonly known as: FLONASE Place 1-2 sprays into both nostrils daily.   FreeStyle Libre 3 Sensor Misc 1 Device by  Does not apply route every 14 (fourteen) days. Apply 1 sensor on  upper arm every 14 days for continuous glucose monitoring   glucose blood test strip Commonly known as: Contour Next Test 1 each by Other route as needed for other. Use as instructed to check blood sugar 1 time daily   hydrochlorothiazide 12.5 MG capsule Commonly known as: MICROZIDE Take 1 capsule (12.5 mg total) by mouth daily. Started by: Reather LittlerAjay Iren Whipp, MD   insulin regular human CONCENTRATED 500 UNIT/ML injection Commonly known as: HUMULIN R Inject into the skin.   Jardiance 10 MG Tabs tablet Generic drug: empagliflozin TAKE 1 TABLET (10 MG TOTAL) BY MOUTH DAILY WITH BREAKFAST.   losartan 100 MG tablet Commonly known as: COZAAR Take by mouth.   metoCLOPramide 5 MG tablet Commonly known as: Reglan Take 1 tablet (5 mg total) by mouth 3 (three) times daily before meals. Started by: Reather LittlerAjay Seaver Machia, MD   pregabalin 25 MG capsule Commonly known as: LYRICA Take by mouth.   rosuvastatin 10 MG tablet Commonly known as: Crestor Take 1 tablet (10 mg total) by mouth daily. What changed:  medication strength how much to take Changed by: Reather LittlerAjay Bryker Fletchall, MD   sucralfate 1 GM/10ML suspension Commonly known as: Carafate Take 10 mLs (1 g total) by mouth 4 (four) times daily -  with meals and at bedtime.        Allergies:  Allergies  Allergen Reactions   Ozempic [Semaglutide] Diarrhea   Erythromycin Nausea And Vomiting   Lisinopril Itching, Other (See Comments) and Cough    Reaction:  Throat itching     Past Medical History:  Diagnosis Date   Diabetes mellitus without complication (HCC)    Hyperlipidemia    Hypertension    Neuromuscular disorder (HCC)    Neuropathy     Past Surgical History:  Procedure Laterality Date   TUBAL LIGATION  2003    Family History  Problem Relation Age of Onset   Hypertension Mother    Heart disease Mother    Hypertension Father    Hypertension Sister    Hypertension Sister     Diabetes Maternal Grandfather    Diabetes Paternal Grandfather     Social History:  reports that she has never smoked. She has never used smokeless tobacco. She reports that she does not drink alcohol and does not use drugs.   Review of Systems   Lipid history: Prescribed simvastatin 40 mg for several years from her PCP Since previously she did not take this regularly because of possible right arm or chest pains she is tolerating CRESTOR 5 mg well since her visit in 11/22  However LDL is still significantly above target of 70-100     Lab Results  Component Value Date   CHOL 199 03/10/2021   HDL 56.60 03/10/2021   LDLCALC 95 09/30/2017   LDLDIRECT 128.0 07/21/2021   TRIG 207.0 (H) 03/10/2021   CHOLHDL 4 03/10/2021           Hypertension: Treated by PCP, on losartan 100 mg from her PCP  Blood pressure varies at home and sometimes relatively high Has not taken her losartan or Jardiance this morning it  BP Readings from Last 3 Encounters:  07/23/21 (!) 150/96  04/28/21 (!) 152/98  07/11/20 120/81    Retinopathy She has eye exams regularly Has proliferative retinopathy and recent vitrectomy : No reports available  Most recent foot exam: 9/22  Complications of diabetes: Has symptoms of neuropathy, having symptoms of burning and numbness, radiculopathy, possible gastroparesis  She is now complaining of periodic nausea  and vomiting after meals, she thinks she previously was given Reglan but it was liquid form and she did not like the taste, not clear if this was helping  Physical Examination:  BP (!) 150/96    Pulse 90    Ht 5' 2.75" (1.594 m)    Wt 187 lb 9.6 oz (85.1 kg)    LMP  (LMP Unknown)    SpO2 98%    BMI 33.50 kg/m   Blood pressure was high 2 times daily No edema  ASSESSMENT:  Diabetes type 2, insulin resistant with complications  See history of present illness for detailed discussion of current diabetes management, blood sugar patterns and problems  identified  A1c is still significantly high at 12.9  Recently with starting JARDIANCE she appears to have much better control although as discussed above her blood sugars are highly variable She is finally using freestyle libre sensor but this has not been confirmed to be accurate She has lower readings overnight as before and has not taken her insulin consistently with the doses given and is generally given the same dose of 100 units with every meal Blood sugars are spiking when she is drinking soft drinks or eating desserts Still not losing weight  HYPERTENSION: Blood pressure is not appearing consistently controlled again with losartan Even with adding Jardiance  History of hypercholesterolemia: Tolerating Crestor but LDL is still 128    PLAN:  She will continue using the freestyle libre Given her guidelines on how to help the sensor stick better Discussed timing of her insulin and adjustment based on Premeal blood sugar and what she is eating or drinking She will need to make sure she takes her insulin with every meal and may reduce slightly if blood sugars are low normal Continue Jardiance 10 mg as this appears to be helping her significantly Insulin instructions:   If eating and waking up during the day: 100 units before breakfast and lunch and 85 before dinner  Additional 20 units with drinking soft drinks or eating desserts  Instead of taking Diflucan every other day she can take it 2-3 times a week and use Monistat as needed otherwise  REGLAN tablet before meals  Crestor 10 mg daily  Start HCTZ 12.5 mg in addition to losartan     Patient Instructions  Reglan 30 min before meals  Diflucan 1-2x per week   Take at least 80 U units before meal if sugar low  Reduce supper dose to 85 at supper  Extra 20 for sweets  Crestor 10mg    HCTZ for BP        07/23/2021, 12:37 PM   Note: This office note was prepared with Dragon voice recognition  system technology. Any transcriptional errors that result from this process are unintentional.

## 2021-07-27 ENCOUNTER — Encounter: Payer: Self-pay | Admitting: Endocrinology

## 2021-08-22 ENCOUNTER — Other Ambulatory Visit: Payer: Self-pay | Admitting: Endocrinology

## 2021-11-05 ENCOUNTER — Other Ambulatory Visit: Payer: Self-pay

## 2021-11-10 ENCOUNTER — Ambulatory Visit: Payer: Self-pay | Admitting: Endocrinology

## 2022-05-11 ENCOUNTER — Other Ambulatory Visit: Payer: Self-pay

## 2022-05-11 DIAGNOSIS — E1165 Type 2 diabetes mellitus with hyperglycemia: Secondary | ICD-10-CM

## 2022-05-11 MED ORDER — ROSUVASTATIN CALCIUM 10 MG PO TABS: 10.0000 mg | ORAL_TABLET | Freq: Every day | ORAL | 0 refills | Status: DC

## 2022-05-18 ENCOUNTER — Other Ambulatory Visit: Payer: Self-pay | Admitting: Endocrinology

## 2022-06-26 ENCOUNTER — Other Ambulatory Visit: Payer: Self-pay | Admitting: Endocrinology

## 2022-09-15 ENCOUNTER — Other Ambulatory Visit: Payer: Self-pay | Admitting: Endocrinology

## 2022-09-15 DIAGNOSIS — E782 Mixed hyperlipidemia: Secondary | ICD-10-CM

## 2022-09-15 DIAGNOSIS — E1165 Type 2 diabetes mellitus with hyperglycemia: Secondary | ICD-10-CM

## 2022-09-17 ENCOUNTER — Other Ambulatory Visit (INDEPENDENT_AMBULATORY_CARE_PROVIDER_SITE_OTHER): Payer: Self-pay

## 2022-09-17 DIAGNOSIS — E782 Mixed hyperlipidemia: Secondary | ICD-10-CM

## 2022-09-17 DIAGNOSIS — Z794 Long term (current) use of insulin: Secondary | ICD-10-CM

## 2022-09-17 DIAGNOSIS — E1165 Type 2 diabetes mellitus with hyperglycemia: Secondary | ICD-10-CM

## 2022-09-17 NOTE — Addendum Note (Signed)
Addended by: Nils Flack I on: 09/17/2022 03:52 PM   Modules accepted: Orders

## 2022-09-21 LAB — COMPREHENSIVE METABOLIC PANEL
ALT: 13 IU/L (ref 0–32)
AST: 13 IU/L (ref 0–40)
Albumin/Globulin Ratio: 1.4 (ref 1.2–2.2)
Albumin: 4.1 g/dL (ref 3.8–4.9)
Alkaline Phosphatase: 189 IU/L — ABNORMAL HIGH (ref 44–121)
BUN/Creatinine Ratio: 31 — ABNORMAL HIGH (ref 9–23)
BUN: 23 mg/dL (ref 6–24)
Bilirubin Total: <0.2 mg/dL (ref 0.0–1.2)
CO2: 23 mmol/L (ref 20–29)
Calcium: 9.9 mg/dL (ref 8.7–10.2)
Chloride: 99 mmol/L (ref 96–106)
Creatinine, Ser: 0.74 mg/dL (ref 0.57–1.00)
Globulin, Total: 3 g/dL (ref 1.5–4.5)
Glucose: 259 mg/dL — ABNORMAL HIGH (ref 70–99)
Potassium: 3.8 mmol/L (ref 3.5–5.2)
Sodium: 142 mmol/L (ref 134–144)
Total Protein: 7.1 g/dL (ref 6.0–8.5)
eGFR: 93 mL/min/{1.73_m2} (ref >59)

## 2022-09-21 LAB — MICROALBUMIN / CREATININE URINE RATIO
Creatinine, Urine: 284 mg/dL
Microalb/Creat Ratio: 60 mg/g creat — ABNORMAL HIGH (ref 0–29)
Microalbumin, Urine: 171.6 ug/mL

## 2022-09-21 LAB — HEMOGLOBIN A1C
Est. average glucose Bld gHb Est-mCnc: 378 mg/dL
Hgb A1c MFr Bld: 14.8 % — ABNORMAL HIGH (ref 4.8–5.6)

## 2022-09-21 LAB — LIPID PANEL
Chol/HDL Ratio: 3.9 ratio (ref 0.0–4.4)
Cholesterol, Total: 239 mg/dL — ABNORMAL HIGH (ref 100–199)
HDL: 61 mg/dL (ref >39)
LDL Chol Calc (NIH): 109 mg/dL — ABNORMAL HIGH (ref 0–99)
Triglycerides: 402 mg/dL — ABNORMAL HIGH (ref 0–149)
VLDL Cholesterol Cal: 69 mg/dL — ABNORMAL HIGH (ref 5–40)

## 2022-09-22 ENCOUNTER — Other Ambulatory Visit: Payer: Self-pay | Admitting: Endocrinology

## 2022-09-22 ENCOUNTER — Ambulatory Visit: Payer: Managed Care, Other (non HMO) | Admitting: Endocrinology

## 2022-09-22 ENCOUNTER — Telehealth: Payer: Self-pay

## 2022-09-22 ENCOUNTER — Encounter: Payer: Self-pay | Admitting: Endocrinology

## 2022-09-22 VITALS — BP 128/82 | HR 82 | Ht 62.0 in | Wt 177.0 lb

## 2022-09-22 DIAGNOSIS — Z794 Long term (current) use of insulin: Secondary | ICD-10-CM | POA: Diagnosis not present

## 2022-09-22 DIAGNOSIS — E782 Mixed hyperlipidemia: Secondary | ICD-10-CM | POA: Diagnosis not present

## 2022-09-22 DIAGNOSIS — E1165 Type 2 diabetes mellitus with hyperglycemia: Secondary | ICD-10-CM | POA: Diagnosis not present

## 2022-09-22 MED ORDER — EMPAGLIFLOZIN 25 MG PO TABS: 25.0000 mg | ORAL_TABLET | Freq: Every day | ORAL | 3 refills | Status: DC

## 2022-09-22 MED ORDER — HUMULIN R U-500 KWIKPEN 500 UNIT/ML ~~LOC~~ SOPN: PEN_INJECTOR | SUBCUTANEOUS | 1 refills | Status: DC

## 2022-09-22 MED ORDER — FREESTYLE LIBRE 3 SENSOR MISC: 1.0000 | 2 refills | Status: DC

## 2022-09-22 MED ORDER — ROSUVASTATIN CALCIUM 10 MG PO TABS: 10.0000 mg | ORAL_TABLET | Freq: Every day | ORAL | 0 refills | Status: DC

## 2022-09-22 NOTE — Telephone Encounter (Signed)
Jardiance requires step therapy per [facility]

## 2022-09-22 NOTE — Progress Notes (Signed)
Patient ID: Tammy Chan, female   DOB: 08-12-1965, 57 y.o.   MRN: [id number]          Reason for Appointment:  follow-up  for Type 2 Diabetes   History of Present Illness:          Date of diagnosis of type 2 diabetes mellitus: 1986       Background history:   She thinks she was started on insulin at time of diagnosis, this was at the age of about 58 She also had tried some oral medications which apparently did not work including metformin Over the years she has taken various insulin regimens including premixed insulin, Lantus and NovoLog Detailed records are not available and not clear what her level of control has been A1c in 2014 and 2015 at [facility] was around 14  Recent history:   INSULIN regimen is: Humulin R U-500 insulin: 80 units TID  A1c is still high at 14.8   Non-insulin hypoglycemic drugs the patient is taking are: Jardiance 10 mg daily  Current management, blood sugar patterns and problems identified: She is not using the freestyle libre as she ran out Checking blood sugars with the meter at work and she brought a written record She was started on JARDIANCE previously but she ran out of this, apparently this was helping previously Last visit was in 1/23 Because of her having less refills on her insulin she is only taking 80 units instead of 100 Also at times will not take her lunch dose She brought her blood sugars from her monitoring that she is doing at work but cannot bring her monitor here Her blood sugars appear to be fluctuating at all times and no consistent pattern Also occasionally fasting reading may be higher from going off her diet the night before Occasionally will still have some regular soft drinks causing high sugars Lab glucose after lunch was 259       Side effects from medications have been: Diarrhea from Ozempic, nausea from metformin, difficulty swallowing large tablets like metformin  Mealtimes:  lunch around 4 PM and dinner at 7 OR   11PM  Blood sugars by review of home records:  PRE-MEAL Fasting Lunch Dinner Bedtime Overall  Glucose range: 151-325 168-265 163-213 188-258   Mean/median:     ?   Previous libre sensor data:  CGM use % of time 56  2-week average/GV 164/44  Time in range      62%  % Time Above 180 23+10  % Time above 250   % Time Below 70 5     PRE-MEAL Fasting Lunch Dinner Bedtime Overall  Glucose range:       Averages: 121 154 172  164   POST-MEAL PC Breakfast PC Lunch PC Dinner  Glucose range:     Averages: 142 189 202    Self-care:  Meal times are:  Breakfast is 9 am Lunch: 11 pm Dinner: 6 pm  Typical meal intake: Breakfast is eggs, toast or cereal, Lunch Is a sandwich, evening meal is  chicken and some rice    Dietician visit, most recent: 8/19  CDE consultation: 2/18                Weight history:  Wt Readings from Last 3 Encounters:  09/22/22 177 lb (80.3 kg)  07/23/21 187 lb 9.6 oz (85.1 kg)  04/28/21 182 lb 3.2 oz (82.6 kg)    Glycemic control: A1c in 1/18 was 15.5    Lab Results  Component Value Date   HGBA1C 14.8 (H) 09/17/2022   HGBA1C 12.8 (H) 07/21/2021   HGBA1C 12.9 (H) 03/10/2021   Lab Results  Component Value Date   MICROALBUR 6.9 (H) 03/10/2021   LDLCALC 109 (H) 09/17/2022   CREATININE 0.74 09/17/2022   Lab Results  Component Value Date   MICRALBCREAT 60 (H) 09/17/2022   Lab Results  Component Value Date   FRUCTOSAMINE 525 (H) 04/24/2021   FRUCTOSAMINE 430 (H) 04/27/2019   FRUCTOSAMINE 455 (H) 06/19/2018       Allergies as of 09/22/2022       Reactions   Ozempic [semaglutide] Diarrhea   Erythromycin Nausea And Vomiting   Lisinopril Itching, Other (See Comments), Cough   Reaction:  Throat itching         Medication List        Accurate as of September 22, 2022  2:01 PM. If you have any questions, ask your nurse or doctor.          STOP taking these medications    hydrochlorothiazide 12.5 MG capsule Commonly known as:  MICROZIDE Stopped by: Elayne Snare, MD   insulin regular human CONCENTRATED 500 UNIT/ML injection Commonly known as: HUMULIN R Replaced by: HumuLIN R U-500 KwikPen 500 UNIT/ML KwikPen Stopped by: Elayne Snare, MD       TAKE these medications    B-D UF III MINI PEN NEEDLES 31G X 5 MM Misc Generic drug: Insulin Pen Needle USE WITH INSULIN PEN 3 TIMES A DAY   Cetirizine HCl 10 MG Caps Take by mouth. What changed: Another medication with the same name was removed. Continue taking this medication, and follow the directions you see here. Changed by: Elayne Snare, MD   empagliflozin 25 MG Tabs tablet Commonly known as: Jardiance Take 1 tablet (25 mg total) by mouth daily before breakfast. What changed:  medication strength how much to take when to take this Changed by: Elayne Snare, MD   fluticasone 50 MCG/ACT nasal spray Commonly known as: FLONASE Place 1-2 sprays into both nostrils daily.   FreeStyle Libre 3 Sensor Misc 1 Device by Does not apply route every 14 (fourteen) days. Apply 1 sensor on upper arm every 14 days for continuous glucose monitoring   glucose blood test strip Commonly known as: Contour Next Test 1 each by Other route as needed for other. Use as instructed to check blood sugar 1 time daily   HumuLIN R U-500 KwikPen 500 UNIT/ML KwikPen Generic drug: insulin regular human CONCENTRATED 100 Units, 30 minutes before each meal Replaces: insulin regular human CONCENTRATED 500 UNIT/ML injection Started by: Elayne Snare, MD   losartan 100 MG tablet Commonly known as: COZAAR Take by mouth.   metoCLOPramide 5 MG tablet Commonly known as: Reglan Take 1 tablet (5 mg total) by mouth 3 (three) times daily before meals.   pregabalin 25 MG capsule Commonly known as: LYRICA Take by mouth.   rosuvastatin 10 MG tablet Commonly known as: Crestor Take 1 tablet (10 mg total) by mouth daily.   sucralfate 1 GM/10ML suspension Commonly known as: Carafate Take 10 mLs (1 g  total) by mouth 4 (four) times daily -  with meals and at bedtime.        Allergies:  Allergies  Allergen Reactions   Ozempic [Semaglutide] Diarrhea   Erythromycin Nausea And Vomiting   Lisinopril Itching, Other (See Comments) and Cough    Reaction:  Throat itching     Past Medical History:  Diagnosis Date  Diabetes mellitus without complication ([place])    Hyperlipidemia    Hypertension    Neuromuscular disorder ([place])    Neuropathy     Past Surgical History:  Procedure Laterality Date   TUBAL LIGATION  2003    Family History  Problem Relation Age of Onset   Hypertension Mother    Heart disease Mother    Hypertension Father    Hypertension Sister    Hypertension Sister    Diabetes Maternal Grandfather    Diabetes Paternal Grandfather     Social History:  reports that she has never smoked. She has never used smokeless tobacco. She reports that she does not drink alcohol and does not use drugs.   Review of Systems   Lipid history: Prescribed Crestor 10 mg but she has been out of this recently  LDL is higher as expected     Lab Results  Component Value Date   CHOL 239 (H) 09/17/2022   HDL 61 09/17/2022   LDLCALC 109 (H) 09/17/2022   LDLDIRECT 128.0 07/21/2021   TRIG 402 (H) 09/17/2022   CHOLHDL 3.9 09/17/2022           Hypertension: Treated by PCP, on losartan 100 mg from her PCP  Blood pressure varies however at work fairly good  BP Readings from Last 3 Encounters:  09/22/22 128/82  07/23/21 (!) 150/96  04/28/21 (!) 152/98    Retinopathy She has eye exams regularly Has proliferative retinopathy and recent vitrectomy : No reports available  Most recent foot exam: [id number]  Complications of diabetes: Has symptoms of neuropathy, having symptoms of burning and numbness, radiculopathy, possible gastroparesis  She is complaining of periodic diarrhea and vomiting after meals, she thinks she previously was given Reglan  Physical Examination:  BP  128/82   Pulse 82   Ht 5\' 2"  (1.575 m)   Wt 177 lb (80.3 kg)   LMP  (LMP Unknown)   SpO2 99%   BMI 32.37 kg/m     ASSESSMENT:  Diabetes type 2, insulin resistant with complications  See history of present illness for detailed discussion of current diabetes management, blood sugar patterns and problems identified  A1c is still significantly high at 14.8  Current regimen is Humulin R U-500 only  She is taking less insulin than prescribed and has not been seen in follow-up for over a year  Although JARDIANCE appears to be improving her control she has not taken this recently Also with variable compliance with lunchtime insulin her blood sugars may be higher in the evening  Discussed that with her variable insulin requirement, difficulty with doing multiple injections, high dose of insulin needed and consistently poor control should look into the OmniPod insulin pump Discussed in detail how this works and given patient information brochure She will look into the cost of this and if affordable let us know and we will prescribe this and start the training Also reminded her to start exercise regimen  HYPERTENSION: Blood pressure is fairly good now, not taking HCTZ previously given  History of hypercholesterolemia: Has been out of Crestor recently and LDL is still over 100  Neuropathy: Fair control of symptoms with Lyrica  PLAN:  She will start using the freestyle libre again and prescription sent for [place] 3 New prescription for all her medication has been sent She will go back to 100 units of insulin before each meal consistently Discussed blood sugar targets fasting and after meals Needs more regular follow-up    To take Crestor  10 mg daily, new prescription given  No change in losartan     There are no Patient Instructions on file for this visit.        Elayne Snare 09/22/2022, 2:01 PM   Note: This office note was prepared with Dragon voice recognition system  technology. Any transcriptional errors that result from this process are unintentional.

## 2022-09-23 ENCOUNTER — Other Ambulatory Visit: Payer: Self-pay | Admitting: Endocrinology

## 2022-09-23 DIAGNOSIS — E1165 Type 2 diabetes mellitus with hyperglycemia: Secondary | ICD-10-CM

## 2022-09-24 ENCOUNTER — Other Ambulatory Visit: Payer: Self-pay | Admitting: Endocrinology

## 2022-10-04 ENCOUNTER — Other Ambulatory Visit (HOSPITAL_COMMUNITY): Payer: Self-pay

## 2022-10-04 NOTE — Telephone Encounter (Signed)
Pharmacy Patient Advocate Encounter   Received notification from Pt calls msgs/CMA/MD that prior authorization for Jardiance is required/requested.  Per Test Claim: requires PA   PA submitted on 10/04/22 to (ins) [employer] ID # [id number] via CoverMyMeds Key or (Medicaid) confirmation # [id number] Status is pending  PA states drug you are requesting isn't covered. Alternative Drugs listed are Metformin (the diff strengths) and [place]. Per test claim for Marcelline Deist, it still requires a PA. It did not ask about trial of Metformin. If it ends up being denied, we can try the other ins the pt has that said step therapy, Metformin preferred. ID # [id number] It wasn't clear which was primary, but the pharmacy had submitted the PA request under the first #.

## 2022-10-09 ENCOUNTER — Other Ambulatory Visit: Payer: Self-pay | Admitting: Endocrinology

## 2022-10-09 DIAGNOSIS — E1165 Type 2 diabetes mellitus with hyperglycemia: Secondary | ICD-10-CM

## 2022-10-15 ENCOUNTER — Telehealth: Payer: Self-pay

## 2022-10-15 NOTE — Telephone Encounter (Signed)
Pharmacy Patient Advocate Encounter  Prior Authorization for Jardiance  tablets has been approved by [employer] (ins).    PA # [id number] Effective dates: 10/04/22 through 06/27/2098

## 2022-10-21 ENCOUNTER — Telehealth: Payer: Self-pay | Admitting: Pharmacy Technician

## 2022-10-21 ENCOUNTER — Other Ambulatory Visit (HOSPITAL_COMMUNITY): Payer: Self-pay

## 2022-10-21 DIAGNOSIS — E782 Mixed hyperlipidemia: Secondary | ICD-10-CM

## 2022-10-21 DIAGNOSIS — E1165 Type 2 diabetes mellitus with hyperglycemia: Secondary | ICD-10-CM

## 2022-10-21 MED ORDER — ATORVASTATIN CALCIUM 20 MG PO TABS: 20.0000 mg | ORAL_TABLET | Freq: Every day | ORAL | 3 refills | Status: DC

## 2022-10-21 NOTE — Telephone Encounter (Signed)
Pt advised rx sent sent pharmacy.

## 2022-10-21 NOTE — Telephone Encounter (Signed)
Pharmacy Patient Advocate Encounter  Received notification from CoverMyMeds that prior authorization for Rosuvastatin is required/requested.  Per Test Claim: Atorvastatin  preferred by the ins. $9 copay for 90 day supply.  If suggested medication is appropriate, Please send in a new RX and discontinue this one. If not, please advise as to why it's not appropriate so that we may request a Prior Authorization.

## 2022-11-16 ENCOUNTER — Ambulatory Visit (INDEPENDENT_AMBULATORY_CARE_PROVIDER_SITE_OTHER): Payer: Managed Care, Other (non HMO) | Admitting: Podiatry

## 2022-11-16 DIAGNOSIS — Z91199 Patient's noncompliance with other medical treatment and regimen due to unspecified reason: Secondary | ICD-10-CM

## 2022-11-16 NOTE — Progress Notes (Signed)
No show

## 2022-11-17 ENCOUNTER — Other Ambulatory Visit (INDEPENDENT_AMBULATORY_CARE_PROVIDER_SITE_OTHER): Payer: Managed Care, Other (non HMO)

## 2022-11-17 DIAGNOSIS — E1165 Type 2 diabetes mellitus with hyperglycemia: Secondary | ICD-10-CM | POA: Diagnosis not present

## 2022-11-17 DIAGNOSIS — Z794 Long term (current) use of insulin: Secondary | ICD-10-CM

## 2022-11-17 LAB — BASIC METABOLIC PANEL
BUN: 13 mg/dL (ref 6–23)
CO2: 26 mEq/L (ref 19–32)
Calcium: 8.9 mg/dL (ref 8.4–10.5)
Chloride: 104 mEq/L (ref 96–112)
Creatinine, Ser: 0.51 mg/dL (ref 0.40–1.20)
GFR: 102.2 mL/min (ref >60.00)
Glucose, Bld: 213 mg/dL — ABNORMAL HIGH (ref 70–99)
Potassium: 3.8 mEq/L (ref 3.5–5.1)
Sodium: 139 mEq/L (ref 135–145)

## 2022-11-18 LAB — FRUCTOSAMINE: Fructosamine: 391 umol/L — ABNORMAL HIGH (ref 0–285)

## 2022-11-25 ENCOUNTER — Encounter: Payer: Self-pay | Admitting: Endocrinology

## 2022-11-25 ENCOUNTER — Ambulatory Visit (INDEPENDENT_AMBULATORY_CARE_PROVIDER_SITE_OTHER): Payer: Managed Care, Other (non HMO) | Admitting: Endocrinology

## 2022-11-25 VITALS — BP 136/88 | HR 95 | Ht 62.0 in | Wt 191.4 lb

## 2022-11-25 DIAGNOSIS — E782 Mixed hyperlipidemia: Secondary | ICD-10-CM | POA: Diagnosis not present

## 2022-11-25 DIAGNOSIS — E1165 Type 2 diabetes mellitus with hyperglycemia: Secondary | ICD-10-CM | POA: Diagnosis not present

## 2022-11-25 DIAGNOSIS — Z794 Long term (current) use of insulin: Secondary | ICD-10-CM

## 2022-11-25 MED ORDER — OMNIPOD 5 DEXG7G6 PODS GEN 5 MISC: 1.0000 | 3 refills | Status: DC

## 2022-11-25 MED ORDER — DEXCOM G6 TRANSMITTER MISC: 1.0000 | Freq: Once | 1 refills | Status: AC

## 2022-11-25 MED ORDER — OMNIPOD 5 DEXG7G6 INTRO GEN 5 KIT: 1.0000 | PACK | Freq: Once | 0 refills | Status: AC

## 2022-11-25 MED ORDER — DEXCOM G6 RECEIVER DEVI: 0 refills | Status: DC

## 2022-11-25 MED ORDER — METOCLOPRAMIDE HCL 5 MG PO TABS: 5.0000 mg | ORAL_TABLET | Freq: Three times a day (TID) | ORAL | 1 refills | Status: AC

## 2022-11-25 NOTE — Patient Instructions (Signed)
Adjust insulin +/- 20 units based on sugar level and trend

## 2022-11-25 NOTE — Progress Notes (Signed)
Patient ID: Tammy Chan, female   DOB: 05/24/1966, 57 y.o.   MRN: [id number]          Reason for Appointment:  follow-up  for Type 2 Diabetes   History of Present Illness:          Date of diagnosis of type 2 diabetes mellitus: 1986       Background history:   She thinks she was started on insulin at time of diagnosis, this was at the age of about 59 She also had tried some oral medications which apparently did not work including metformin Over the years she has taken various insulin regimens including premixed insulin, Lantus and NovoLog Detailed records are not available and not clear what her level of control has been A1c in 2014 and 2015 at [facility] was around 14  Recent history:   INSULIN regimen is: Humulin R U-500 insulin: 100 units TID  A1c is last high at 14.8 Fructosamine is now 391 compared to 525 previously  Non-insulin hypoglycemic drugs the patient is taking are: Jardiance 25 mg daily  Current management, blood sugar patterns and problems identified: She is back to using the freestyle libre since her last visit 2 months ago  She was started on JARDIANCE 25 mg daily  She has had more problems with her nausea, vomiting and diarrhea and she thinks this is causing her sugars to fluctuate Her blood sugars are highly inconsistent from day-to-day and has some days where the blood sugars are persistently below over 300 despite no change in diet She is very consistent with her insulin dose 3 times a day but is not adjusting it based on what she is eating or the blood sugar level Occasionally she has had low blood sugars both during the day and some during the night also She was told to look into the insulin pump on the last visit but she has not followed up on this       Side effects from medications have been: Diarrhea from Ozempic, nausea from metformin, difficulty swallowing large tablets like metformin  Mealtimes:  lunch around 4 PM and dinner at 7 OR   11PM  Freestyle libre sensor download interpreted as follows for the last 2 weeks  High variability is present from day-to-day but most of the blood sugars are running fairly consistently high Overnight blood sugars are generally significantly high with only 1 episode of hypoglycemia on the 25th and also transiently on 27th.  Blood sugars are highly variable overnight also No consistent pattern is seen during the day Postprandial readings occasionally seem to spike fairly rapidly but this is infrequent On at least 3 days her blood sugars are very persistently high for the whole day Hypoglycemia during the day has been occurring occasionally before and after midday  CGM use % of time 96  2-week average/GV 211/45  Time in range   37     %  % Time Above 180 21  Above 250 36%  % Time Below 70 6     PRE-MEAL Morning Lunch Dinner Bedtime Overall  Glucose range:       Averages: 196 198      POST-MEAL PC Breakfast PC Lunch PC Dinner  Glucose range:     Averages: 216 ? 233     The previous blood sugars by review of home records:  PRE-MEAL Fasting Lunch Dinner Bedtime Overall  Glucose range: 151-325 168-265 163-213 188-258   Mean/median:     ?  Self-care:  Meal times are:  Breakfast is 9 am Lunch: 11 pm Dinner: 6 pm  Typical meal intake: Breakfast is eggs, toast or cereal, Lunch Is a sandwich, evening meal is  chicken and some rice    Dietician visit, most recent: 8/19  CDE consultation: 2/18                Weight history:  Wt Readings from Last 3 Encounters:  11/25/22 191 lb 6.4 oz (86.8 kg)  09/22/22 177 lb (80.3 kg)  07/23/21 187 lb 9.6 oz (85.1 kg)    Glycemic control: A1c in 1/18 was 15.5    Lab Results  Component Value Date   HGBA1C 14.8 (H) 09/17/2022   HGBA1C 12.8 (H) 07/21/2021   HGBA1C 12.9 (H) 03/10/2021   Lab Results  Component Value Date   MICROALBUR 6.9 (H) 03/10/2021   LDLCALC 109 (H) 09/17/2022   CREATININE 0.51 11/17/2022   Lab Results   Component Value Date   MICRALBCREAT 60 (H) 09/17/2022   Lab Results  Component Value Date   FRUCTOSAMINE 391 (H) 11/17/2022   FRUCTOSAMINE 525 (H) 04/24/2021   FRUCTOSAMINE 430 (H) 04/27/2019       Allergies as of 11/25/2022       Reactions   Ozempic [semaglutide] Diarrhea   Erythromycin Nausea And Vomiting   Lisinopril Itching, Other (See Comments), Cough   Reaction:  Throat itching         Medication List        Accurate as of Nov 25, 2022  8:27 AM. If you have any questions, ask your nurse or doctor.          atorvastatin 20 MG tablet Commonly known as: LIPITOR Take 1 tablet (20 mg total) by mouth daily.   B-D UF III MINI PEN NEEDLES 31G X 5 MM Misc Generic drug: Insulin Pen Needle USE WITH INSULIN PEN 3 TIMES A DAY   Cetirizine HCl 10 MG Caps Take by mouth.   empagliflozin 25 MG Tabs tablet Commonly known as: Jardiance Take 1 tablet (25 mg total) by mouth daily before breakfast.   fluticasone 50 MCG/ACT nasal spray Commonly known as: FLONASE Place 1-2 sprays into both nostrils daily.   FreeStyle Libre 3 Sensor Misc 1 Device by Does not apply route every 14 (fourteen) days. Apply 1 sensor on upper arm every 14 days for continuous glucose monitoring   glucose blood test strip Commonly known as: Contour Next Test 1 each by Other route as needed for other. Use as instructed to check blood sugar 1 time daily   HumuLIN R U-500 KwikPen 500 UNIT/ML KwikPen Generic drug: insulin regular human CONCENTRATED 100 Units, 30 minutes before each meal   losartan 100 MG tablet Commonly known as: COZAAR Take by mouth.   metoCLOPramide 5 MG tablet Commonly known as: Reglan Take 1 tablet (5 mg total) by mouth 3 (three) times daily before meals.   pregabalin 25 MG capsule Commonly known as: LYRICA Take by mouth.   sucralfate 1 GM/10ML suspension Commonly known as: Carafate Take 10 mLs (1 g total) by mouth 4 (four) times daily -  with meals and at  bedtime.        Allergies:  Allergies  Allergen Reactions   Ozempic [Semaglutide] Diarrhea   Erythromycin Nausea And Vomiting   Lisinopril Itching, Other (See Comments) and Cough    Reaction:  Throat itching     Past Medical History:  Diagnosis Date   Diabetes mellitus without complication (HCC)  Hyperlipidemia    Hypertension    Neuromuscular disorder (HCC)    Neuropathy     Past Surgical History:  Procedure Laterality Date   TUBAL LIGATION  2003    Family History  Problem Relation Age of Onset   Hypertension Mother    Heart disease Mother    Hypertension Father    Hypertension Sister    Hypertension Sister    Diabetes Maternal Grandfather    Diabetes Paternal Grandfather     Social History:  reports that she has never smoked. She has never used smokeless tobacco. She reports that she does not drink alcohol and does not use drugs.   Review of Systems   Lipid history: Prescribed Crestor 10 mg but she has been out of this when she had her labs checked in March  LDL was higher as expected     Lab Results  Component Value Date   CHOL 239 (H) 09/17/2022   HDL 61 09/17/2022   LDLCALC 109 (H) 09/17/2022   LDLDIRECT 128.0 07/21/2021   TRIG 402 (H) 09/17/2022   CHOLHDL 3.9 09/17/2022           Hypertension: Treated by PCP, on losartan 100 mg from her PCP   BP Readings from Last 3 Encounters:  11/25/22 136/88  09/22/22 128/82  07/23/21 (!) 150/96    Retinopathy She has eye exams regularly Has proliferative retinopathy and recent vitrectomy : No reports available  Most recent foot exam: 9/22  Complications of diabetes: Has symptoms of neuropathy, having symptoms of burning and numbness, radiculopathy, possible gastroparesis  She is still having frequent diarrhea and vomiting after meals, she is waiting for GI consultation Previously was given Reglan but she has not taken this for a while She does have symptoms of early satiety  Physical  Examination:  BP 136/88 (BP Location: Left Arm, Patient Position: Sitting, Cuff Size: Normal)   Pulse 95   Ht 5\' 2"  (1.575 m)   Wt 191 lb 6.4 oz (86.8 kg)   LMP  (LMP Unknown)   SpO2 98%   BMI 35.01 kg/m     ASSESSMENT:  Diabetes type 2, insulin resistant with complications  See history of present illness for detailed discussion of current diabetes management, blood sugar patterns and problems identified  A1c is last 14.8  Current regimen is Humulin R U-500 only  She is taking her insulin regularly along with Jardiance but blood sugar control is highly erratic Also her food intake is inconsistent because of GI symptoms Overall blood sugars are still quite high with  HYPERTENSION: Blood pressure is fairly good   Vomiting and diarrhea: She does have some symptoms suggestive of gastroparesis with not clear why she is having diarrhea   PLAN:  She will pick up the prescription for the Dexcom and the OmniPod 5 from the pharmacy and let us know if this is covered She will set up training for this when she has the supplies Also will try to use the Humalog U-200 insulin because of her high insulin requirement In the meantime she will need to adjust her insulin doses based on her blood sugar trend and overall level with extra 20 to 30 units when the blood sugars are rising or excessively high but reduce it by 20 to 30 units when blood sugars are low normal or low  Consistent diet with some protein at each meal and avoid any regular soft drinks She will call if she is unable to get the pump Continue  the base dose of 100 units of insulin before each meal consistently Trial of REGLAN 5 mg before meals and bedtime while getting GI evaluation done  Check lipids in the next visit  No change in losartan   There are no Patient Instructions on file for this visit.    Reather Littler 11/25/2022, 8:27 AM   Note: This office note was prepared with Dragon voice recognition system technology.  Any transcriptional errors that result from this process are unintentional.

## 2022-11-30 ENCOUNTER — Telehealth: Payer: Self-pay | Admitting: Endocrinology

## 2022-11-30 ENCOUNTER — Other Ambulatory Visit: Payer: Self-pay | Admitting: Gastroenterology

## 2022-11-30 DIAGNOSIS — R112 Nausea with vomiting, unspecified: Secondary | ICD-10-CM

## 2022-11-30 DIAGNOSIS — R748 Abnormal levels of other serum enzymes: Secondary | ICD-10-CM

## 2022-11-30 MED ORDER — OMNIPOD 5 DEXG7G6 PODS GEN 5 MISC: 1.0000 | 3 refills | Status: AC

## 2022-11-30 MED ORDER — DEXCOM G6 RECEIVER DEVI: 0 refills | Status: DC

## 2022-11-30 NOTE — Telephone Encounter (Signed)
MEDICATION: 1)  excom G6 Receiver Continuous Glucose Receiver (DEXCOM G6 RECEIVER) DEVI  2)  Omnipod 5 G6 Pods (Gen 5) Insulin Disposable Pump (OMNIPOD 5 G6 PODS, GEN 5,) MISC  PHARMACY:    [id number] - [place], [place] - [address] (Ph: [phone])    HAS THE PATIENT CONTACTED THEIR PHARMACY?  Yes  IS THIS A 90 DAY SUPPLY : Yes  IS PATIENT OUT OF MEDICATION: No  IF NOT; HOW MUCH IS LEFT: Not sure  LAST APPOINTMENT DATE: @5 /30/2024  NEXT APPOINTMENT DATE:@7 /16/2024  DO WE HAVE YOUR PERMISSION TO LEAVE A DETAILED MESSAGE?:  Yes  OTHER COMMENTS: PATIENT STATES THAT A PA NEEDS TO BE DONE.   **Let patient know to contact pharmacy at the end of the day to make sure medication is ready. **  ** Please notify patient to allow 48-72 hours to process**  **Encourage patient to contact the pharmacy for refills or they can request refills through [facility]**

## 2022-11-30 NOTE — Telephone Encounter (Signed)
Requested Prescriptions   Signed Prescriptions Disp Refills   Insulin Disposable Pump (OMNIPOD 5 G6 PODS, GEN 5,) MISC 2 each 3    Sig: 1 Device by Does not apply route every 3 (three) days.    Authorizing Provider: Reather Littler    Ordering User: Hyacinth Meeker, Ilham Roughton S   Continuous Glucose Receiver (DEXCOM G6 RECEIVER) DEVI 1 each 0    Sig: Use to display glucose from sensor    Authorizing Provider: Reather Littler    Ordering User: Pollie Meyer  .

## 2022-12-02 ENCOUNTER — Encounter: Payer: Self-pay | Admitting: Endocrinology

## 2022-12-10 ENCOUNTER — Telehealth: Payer: Self-pay

## 2022-12-10 NOTE — Telephone Encounter (Signed)
Dexcom supplies need PA.  

## 2022-12-13 ENCOUNTER — Other Ambulatory Visit: Payer: Self-pay | Admitting: Endocrinology

## 2022-12-20 ENCOUNTER — Telehealth: Payer: Self-pay | Admitting: Dietician

## 2022-12-20 NOTE — Telephone Encounter (Signed)
Called patient who needs Dexcom G6 and Omnipod 5 training.  She has the pump but has been having difficulty obtaining the G6.  Prior Berkley Harvey was requested on 6/14.  Patient states she is willing to pay for this out of pocket as she wants to get the pump started.  Forwarded message to medical assistant re:  Dexcom G6 prior auth status. Forwarded a message to [place] to call patient for training when she returns.  Oran Rein, RD, LDN, CDCES

## 2022-12-21 ENCOUNTER — Telehealth: Payer: Self-pay | Admitting: Endocrinology

## 2022-12-21 ENCOUNTER — Telehealth: Payer: Self-pay

## 2022-12-21 NOTE — Telephone Encounter (Signed)
High Priority message has been sent to the PA team.

## 2022-12-21 NOTE — Telephone Encounter (Signed)
Patient is calling to say that she has never received the Dexcom G6 receivers or sensors and her insurance is requiring a prior authorization.  Patient states that she has called several times concerning this issue and would like it resolved as soon as possible.

## 2022-12-21 NOTE — Telephone Encounter (Signed)
Please start PA for Dexcom G6 Sensors and Transmitters.  Insurance company is Medical sales representative.

## 2022-12-21 NOTE — Telephone Encounter (Signed)
I called and spoke with the patient and she will be coming to get a sample on Thursday.

## 2022-12-21 NOTE — Telephone Encounter (Signed)
Error

## 2022-12-22 ENCOUNTER — Other Ambulatory Visit (HOSPITAL_COMMUNITY): Payer: Self-pay

## 2022-12-22 ENCOUNTER — Telehealth: Payer: Self-pay

## 2022-12-22 NOTE — Telephone Encounter (Signed)
Patient Advocate Encounter   Received notification from pt msgs that prior authorization is required for Dexcom G6 sensors and transmitter  Submitted: 12/22/22 Sensor Key [id number] Transmitter key [id number]  Status is pending

## 2022-12-23 ENCOUNTER — Telehealth: Payer: Self-pay | Admitting: Endocrinology

## 2022-12-23 NOTE — Telephone Encounter (Signed)
Patient is calling to say that she was told by [employer] that a referral needed to be put in the system before they could schedule her for the pump training.

## 2022-12-23 NOTE — Telephone Encounter (Signed)
Patient given sample of Dexcom G6

## 2022-12-24 NOTE — Telephone Encounter (Signed)
Pharmacy Patient Advocate Encounter  Prior Authorization for Dexcom supplies has been APPROVED  Effective 12/02/22 through 12/24/23

## 2022-12-27 ENCOUNTER — Other Ambulatory Visit: Payer: Self-pay | Admitting: Endocrinology

## 2022-12-27 ENCOUNTER — Other Ambulatory Visit: Payer: Self-pay | Admitting: Family Medicine

## 2022-12-27 DIAGNOSIS — Z1231 Encounter for screening mammogram for malignant neoplasm of breast: Secondary | ICD-10-CM

## 2022-12-27 DIAGNOSIS — Z794 Long term (current) use of insulin: Secondary | ICD-10-CM

## 2022-12-27 NOTE — Telephone Encounter (Signed)
Pump appointment scheduled for 2 weeks.

## 2023-01-03 ENCOUNTER — Telehealth: Payer: Self-pay

## 2023-01-03 ENCOUNTER — Ambulatory Visit
Admission: RE | Admit: 2023-01-03 | Discharge: 2023-01-03 | Disposition: A | Discharge status: Home / Self Care | Payer: Managed Care, Other (non HMO) | Source: Ambulatory Visit | Attending: Gastroenterology | Admitting: Gastroenterology

## 2023-01-03 DIAGNOSIS — R112 Nausea with vomiting, unspecified: Secondary | ICD-10-CM

## 2023-01-03 DIAGNOSIS — R748 Abnormal levels of other serum enzymes: Secondary | ICD-10-CM

## 2023-01-03 MED ORDER — IOPAMIDOL (ISOVUE-300) INJECTION 61%
100.0000 mL | Freq: Once | INTRAVENOUS | Status: AC | PRN
  Administered 2023-01-03: 100 mL via INTRAVENOUS

## 2023-01-03 NOTE — Telephone Encounter (Signed)
Patient was called to see if she received her samples of the G6 sensors. Patient stated that she has received them

## 2023-01-05 ENCOUNTER — Encounter: Payer: Managed Care, Other (non HMO) | Attending: Endocrinology | Admitting: Nutrition

## 2023-01-05 DIAGNOSIS — E1165 Type 2 diabetes mellitus with hyperglycemia: Secondary | ICD-10-CM | POA: Insufficient documentation

## 2023-01-05 DIAGNOSIS — Z794 Long term (current) use of insulin: Secondary | ICD-10-CM | POA: Insufficient documentation

## 2023-01-05 NOTE — Progress Notes (Signed)
Patient was trained and started on the Dexcom G6 sensors.  A sensor was placed on her left upper arm and the transmitter was linked to her phone.  THe clarity app was also downloaded to her phone and linked to [place] [facility].  She will return on Monday to start her pump

## 2023-01-05 NOTE — Telephone Encounter (Signed)
Please order the [facility] [address]  She is also on her last u-500R pen, and will need the insulin in a vial for her pump.  Please order this today.

## 2023-01-05 NOTE — Patient Instructions (Signed)
Change sensor every 10 days. Change transmitter every 3 months. Call Dexcom help line if questions or problems with your sensor

## 2023-01-06 ENCOUNTER — Other Ambulatory Visit: Payer: Self-pay

## 2023-01-06 DIAGNOSIS — E1165 Type 2 diabetes mellitus with hyperglycemia: Secondary | ICD-10-CM

## 2023-01-06 MED ORDER — DEXCOM G6 SENSOR MISC
15 refills | Status: DC
Start: 2023-01-06 — End: 2023-05-16

## 2023-01-06 MED ORDER — HUMULIN R U-500 KWIKPEN 500 UNIT/ML ~~LOC~~ SOPN
PEN_INJECTOR | SUBCUTANEOUS | 3 refills | Status: DC
Start: 2023-01-06 — End: 2023-02-01

## 2023-01-10 ENCOUNTER — Encounter: Payer: Managed Care, Other (non HMO) | Admitting: Nutrition

## 2023-01-10 DIAGNOSIS — E1165 Type 2 diabetes mellitus with hyperglycemia: Secondary | ICD-10-CM

## 2023-01-10 NOTE — Progress Notes (Unsigned)
Patient was trained on the use of the OmniPod 5 insulin pump.  We discussed how this pump delivers insulin and the need to give insulin for all meals and snacks.   Settings were put into the pump per Dr. Remus Blake orders.  Basal rate: 0.8u/hr, I/C ratio: 1 with written directions for 8u for smaller meals, and 10u for larger meals, ISF: 40, target: 120 with corrections over 150, timing 6 hours, and max basal: 2.0 u/hr, max bolus:15.    She took 100u of U-500 insulin this morning at 9:30, and did not eat breakfast.  Her blood sugar dropped to 48, and she drank 8 ounces of juice, and was given 12 peanut butter crackers.  Her blood sugar rose to 95 in 30 minutes.   She filled a pod with U 500 R insulin and attached this to her right abdomen.  We discussed proper placement of the pod with respect to the location of her dexcom sensor.  She reported good understanding of the need to wear the pod on the same side of the body as the sensor, or directly across from it.   The pump was linked to [facility] and to [place] [facility].  Her dexcom was linked to her pdm.  She was shown how to bolus and she redemonstrated this X2 correctly.   Her pump was put into a 100% basal reduction mode until 4PM today.   We discussed alerts and alarms and was reminded not to take any more injections of insulin.  She reported good understanding of this.   She was encouraged to read the starter booklet and to go on line to read the manual.  She reported that she was going home after this and read the booklet.  She was encouraged to call omniPod if she has any questions.  I will see her tomorrow to finish training, and review all topics discussed today.

## 2023-01-10 NOTE — Progress Notes (Unsigned)
e

## 2023-01-10 NOTE — Progress Notes (Incomplete)
Patient was trained on the use of the OmniPod 5 insulin pump.  We discussed how this pump delivers insulin and the need to give insulin for all meals and snacks.   Settings were put into the pump per Dr. Remus Blake orders.  Basal rate: 1.2u/hr, I/C ratio: 1 with written directions for 8u for smaller meals, and 10u for larger meals, ISF: 30, target: 120 with corrections over 150, timing 6 hours, and max basal: 2.5u/hr, max bolus 20.    She took 100u of U-500 insulin this morning at 9:30, and did not eat breakfast.  Her blood sugar dropped to 48, and she drank 8 ounces of juice, and was given 12 peanut butter crackers.  Her blood sugar rose to 95 in 30 minutes.   She filled a cartridge with U500 R insulin and attached this to her right abdomen.  We discussed proper placement of the pod with respect to the location of her dexcom sensor.  She reported good understanding of the need to wear the pod on the same side of the body as the sensor, or directly across from it.   The pump was linked to [facility] and to [place] [facility].  Her dexcom was linked to her pdm.  She was shown how to bolus and she redemonstrated this X2 correctly.   Her pump was put into a 100% basal reduction mode until 4PM today.   We discussed alerts and alarms and was reminded not to take any more injections of insulin.  She reported good understanding of this.   She was encouraged to read the starter booklet and to go on line to read the manual.  She reported that she was going home after this and read the booklet.  She was encouraged to call omniPod if she has any questions.  I will see her tomorrow to finish training, and review all topics discussed today.

## 2023-01-11 ENCOUNTER — Encounter: Payer: Self-pay | Admitting: Endocrinology

## 2023-01-11 ENCOUNTER — Telehealth: Payer: Self-pay | Admitting: Endocrinology

## 2023-01-11 ENCOUNTER — Ambulatory Visit: Payer: Managed Care, Other (non HMO) | Admitting: Endocrinology

## 2023-01-11 VITALS — BP 122/78 | HR 89 | Ht 62.0 in | Wt 197.0 lb

## 2023-01-11 DIAGNOSIS — Z7984 Long term (current) use of oral hypoglycemic drugs: Secondary | ICD-10-CM | POA: Diagnosis not present

## 2023-01-11 DIAGNOSIS — E1165 Type 2 diabetes mellitus with hyperglycemia: Secondary | ICD-10-CM | POA: Diagnosis not present

## 2023-01-11 DIAGNOSIS — E782 Mixed hyperlipidemia: Secondary | ICD-10-CM

## 2023-01-11 DIAGNOSIS — Z794 Long term (current) use of insulin: Secondary | ICD-10-CM | POA: Diagnosis not present

## 2023-01-11 LAB — POCT GLYCOSYLATED HEMOGLOBIN (HGB A1C): Hemoglobin A1C: 10.7 % — AB (ref 4.0–5.6)

## 2023-01-11 NOTE — Patient Instructions (Signed)
Bolus 30 min before meals

## 2023-01-11 NOTE — Patient Instructions (Signed)
Give a bolus for all meals, making sure to put in blood sugar readings before eating Read over starter booklet Call OmniPod if questions about how to use the pump Call Dexcom if questions or problems with sensors

## 2023-01-12 ENCOUNTER — Telehealth: Payer: Self-pay | Admitting: Nutrition

## 2023-01-12 NOTE — Telephone Encounter (Signed)
Patient reported no difficulty giving boluses.  Reports no lows last night.  FBS today was 190.  She had no questions for me at this time.Follow-up appointment scheduled for next week.

## 2023-01-19 ENCOUNTER — Encounter: Payer: Managed Care, Other (non HMO) | Admitting: Nutrition

## 2023-01-19 DIAGNOSIS — E1165 Type 2 diabetes mellitus with hyperglycemia: Secondary | ICD-10-CM

## 2023-01-19 NOTE — Progress Notes (Signed)
Pump download shows 33% in target range with 67% in high range, no very high readings and no lows.  Highs are before and always after supper.  Pt. Is snacking at this time at work because supper is always delayed until 8-10PM.  Snacking on fruit, nabs etc,, without any bolus.  Also not doing correction doses when blood sugars are high. Dexcom reading showing average of 176 for last 2 weeks, with 48% time in range and 3% lows, which are occurring around 2AM.   She was shown again how to do this, and written instructions given for this. Pt. Reports no difficulty filling or wearing pods or giving boluses.   We reviewed high blood sugar protocols and sick day guidelines.  She had no final questions Again reverberated need to give insulin for all snacks and do correction doses on all blood sugars over 120.

## 2023-01-19 NOTE — Patient Instructions (Signed)
Give bolus of 1-2 units whenever eating snacks Do a correction bolus whenever blood sugar is over 250.

## 2023-01-21 ENCOUNTER — Ambulatory Visit: Payer: Managed Care, Other (non HMO)

## 2023-01-28 ENCOUNTER — Other Ambulatory Visit (INDEPENDENT_AMBULATORY_CARE_PROVIDER_SITE_OTHER): Payer: Managed Care, Other (non HMO)

## 2023-01-28 DIAGNOSIS — E1165 Type 2 diabetes mellitus with hyperglycemia: Secondary | ICD-10-CM | POA: Diagnosis not present

## 2023-01-28 DIAGNOSIS — Z794 Long term (current) use of insulin: Secondary | ICD-10-CM

## 2023-01-28 DIAGNOSIS — E782 Mixed hyperlipidemia: Secondary | ICD-10-CM

## 2023-01-28 LAB — COMPREHENSIVE METABOLIC PANEL
ALT: 25 U/L (ref 0–35)
AST: 27 U/L (ref 0–37)
Albumin: 3.9 g/dL (ref 3.5–5.2)
Alkaline Phosphatase: 176 U/L — ABNORMAL HIGH (ref 39–117)
BUN: 15 mg/dL (ref 6–23)
CO2: 29 mEq/L (ref 19–32)
Calcium: 9.7 mg/dL (ref 8.4–10.5)
Chloride: 103 mEq/L (ref 96–112)
Creatinine, Ser: 0.59 mg/dL (ref 0.40–1.20)
GFR: 98.54 mL/min (ref >60.00)
Glucose, Bld: 90 mg/dL (ref 70–99)
Potassium: 4 mEq/L (ref 3.5–5.1)
Sodium: 140 mEq/L (ref 135–145)
Total Bilirubin: 0.3 mg/dL (ref 0.2–1.2)
Total Protein: 7.4 g/dL (ref 6.0–8.3)

## 2023-01-28 LAB — LIPID PANEL
Cholesterol: 193 mg/dL (ref 0–200)
HDL: 57.7 mg/dL (ref >39.00)
LDL Cholesterol: 99 mg/dL (ref 0–99)
NonHDL: 135.62
Total CHOL/HDL Ratio: 3
Triglycerides: 181 mg/dL — ABNORMAL HIGH (ref 0.0–149.0)
VLDL: 36.2 mg/dL (ref 0.0–40.0)

## 2023-01-28 LAB — HEMOGLOBIN A1C: Hgb A1c MFr Bld: 10.2 % — ABNORMAL HIGH (ref 4.6–6.5)

## 2023-02-01 ENCOUNTER — Ambulatory Visit (INDEPENDENT_AMBULATORY_CARE_PROVIDER_SITE_OTHER): Payer: Managed Care, Other (non HMO) | Admitting: Endocrinology

## 2023-02-01 ENCOUNTER — Encounter: Payer: Self-pay | Admitting: Endocrinology

## 2023-02-01 VITALS — BP 124/80 | HR 85 | Ht 62.0 in | Wt 202.0 lb

## 2023-02-01 DIAGNOSIS — E782 Mixed hyperlipidemia: Secondary | ICD-10-CM | POA: Diagnosis not present

## 2023-02-01 DIAGNOSIS — E1165 Type 2 diabetes mellitus with hyperglycemia: Secondary | ICD-10-CM | POA: Diagnosis not present

## 2023-02-01 DIAGNOSIS — Z794 Long term (current) use of insulin: Secondary | ICD-10-CM

## 2023-02-01 MED ORDER — HUMULIN R U-500 (CONCENTRATED) 500 UNIT/ML ~~LOC~~ SOLN: SUBCUTANEOUS | 0 refills | Status: DC

## 2023-02-01 NOTE — Progress Notes (Signed)
Patient ID: Tammy Chan, female   DOB: 12-01-1965, 57 y.o.   MRN: [id number]          Reason for Appointment:  follow-up  for Type 2 Diabetes   History of Present Illness:          Date of diagnosis of type 2 diabetes mellitus: 1986       Background history:   She thinks she was started on insulin at time of diagnosis, this was at the age of about 29 She also had tried some oral medications which apparently did not work including metformin Over the years she has taken various insulin regimens including premixed insulin, Lantus and NovoLog Detailed records are not available and not clear what her level of control has been A1c in 2014 and 2015 at [facility] was around 12  Recent history:   INSULIN regimen previously: Humulin R U-500 insulin: 100 units TID  A1c is 10.7, still high, previously 14.8  OMNIPOD INSULIN PUMP SETTINGS, Started on 01/10/2023    Basal rate: 0.8u/hr, I/C ratio: 1 with written directions for 8u for smaller meals, and 10u for larger meals, ISF: 40, target: 120 with corrections over 150, timing 6 hours, and max basal: 2.0 u/hr, max bolus:15 units.   Non-insulin hypoglycemic drugs the patient is taking are: Jardiance 25 mg daily  Current management, blood sugar patterns and problems identified: She has been on the OmniPod 5 insulin pump using U-500 insulin since 7/24  Her blood sugars are much better with the pump compared to her injectable treatment with recently 69% blood sugars within the target range compared to 37 However she does have some variability at all times including late evening when her blood sugars fluctuate She does think she is trying to bolus 30 minutes before her evening meal at least but sometimes her blood sugars are higher either before or after eating With using a 1: 1 carb ratio she is mostly bolusing 8 units for her evening meals without much adjustment for what she is eating, highest bolus amount has been 10 units However no consistent  pattern is seen in postprandial hyperglycemia No blood sugars overnight fluctuate and only occasionally slightly low; some of this may be related to prolonged action of her evening bolus Is been able to keep the automated mode 90% of the time Since she is overall feeling more energetic she is starting some exercise programs at times in the mornings and this may cause low sugars, does not know how to use the activity mode       Side effects from medications have been: Diarrhea from Ozempic, nausea from metformin, difficulty swallowing large tablets like metformin  Mealtimes:  lunch around 4 PM and dinner at 7 OR  11PM  Analysis of her Dexcom sensor download for the last 2 weeks indicates the following  Moderate variability is present with standard deviation 57 Although she is in target range most of the time her hyperglycemia is seen either overnight or evenings after about 5 PM through late at night OVERNIGHT blood sugars are generally lower early part of the night and then gradually increasing but no consistent pattern; however occasionally may have mildly low blood sugars transiently Blood sugars during the day are fairly good but may occasionally get low late morning or early afternoon Postprandial readings after dinner as discussed above are somewhat variable with going above the target range about one third of the time at least No hypoglycemia in the evenings AVERAGE blood sugar between  5 PM-9 PM is about 180  CGM use % of time 93  2-week average/GV 150/38  Time in range        % 69  % Time Above 180 22  % Time above 250 5  % Time Below 70 4   Previous data:  Previous freestyle libre data:  CGM use % of time 96  2-week average/GV 211/45  Time in range   37     %  % Time Above 180 21  Above 250 36%  % Time Below 70 6     PRE-MEAL Morning Lunch Dinner Bedtime Overall  Glucose range:       Averages: 196 198      POST-MEAL PC Breakfast PC Lunch PC Dinner  Glucose range:      Averages: 216 ? 233      Self-care:  Meal times are:  Breakfast is 9 am Lunch: 11 pm Dinner: 6 pm  Typical meal intake: Breakfast is eggs, toast or cereal, Lunch Is a sandwich, evening meal is  chicken and some rice    Dietician visit, most recent: 8/19  CDE consultation: 2/18                Weight history:  Wt Readings from Last 3 Encounters:  02/01/23 202 lb (91.6 kg)  01/11/23 197 lb (89.4 kg)  11/25/22 191 lb 6.4 oz (86.8 kg)    Glycemic control: A1c in 1/18 was 15.5    Lab Results  Component Value Date   HGBA1C 10.2 (H) 01/28/2023   HGBA1C 10.7 (A) 01/11/2023   HGBA1C 14.8 (H) 09/17/2022   Lab Results  Component Value Date   MICROALBUR 6.9 (H) 03/10/2021   LDLCALC 99 01/28/2023   CREATININE 0.59 01/28/2023   Lab Results  Component Value Date   MICRALBCREAT 60 (H) 09/17/2022   Lab Results  Component Value Date   FRUCTOSAMINE 391 (H) 11/17/2022   FRUCTOSAMINE 525 (H) 04/24/2021   FRUCTOSAMINE 430 (H) 04/27/2019       Allergies as of 02/01/2023       Reactions   Ozempic [semaglutide] Diarrhea   Erythromycin Nausea And Vomiting   Lisinopril Itching, Other (See Comments), Cough   Reaction:  Throat itching         Medication List        Accurate as of February 01, 2023  9:34 AM. If you have any questions, ask your nurse or doctor.          atorvastatin 20 MG tablet Commonly known as: LIPITOR Take 1 tablet (20 mg total) by mouth daily.   B-D UF III MINI PEN NEEDLES 31G X 5 MM Misc Generic drug: Insulin Pen Needle USE WITH INSULIN PEN 3 TIMES A DAY   Cetirizine HCl 10 MG Caps Take by mouth.   Dexcom G6 Receiver Devi Use to display glucose from sensor   Dexcom G6 Sensor Misc Change sensor every 10 days   empagliflozin 25 MG Tabs tablet Commonly known as: Jardiance Take 1 tablet (25 mg total) by mouth daily before breakfast.   fluticasone 50 MCG/ACT nasal spray Commonly known as: FLONASE Place 1-2 sprays into both nostrils daily.    glucose blood test strip Commonly known as: Contour Next Test 1 each by Other route as needed for other. Use as instructed to check blood sugar 1 time daily   HumuLIN R U-500 KwikPen 500 UNIT/ML KwikPen Generic drug: insulin regular human CONCENTRATED 100 Units, 30 minutes before each meal  losartan 100 MG tablet Commonly known as: COZAAR Take by mouth.   metoCLOPramide 5 MG tablet Commonly known as: Reglan Take 1 tablet (5 mg total) by mouth 4 (four) times daily -  before meals and at bedtime.   Omnipod 5 G6 Pods (Gen 5) Misc 1 Device by Does not apply route every 3 (three) days.   pregabalin 25 MG capsule Commonly known as: LYRICA Take by mouth.   sucralfate 1 GM/10ML suspension Commonly known as: Carafate Take 10 mLs (1 g total) by mouth 4 (four) times daily -  with meals and at bedtime.        Allergies:  Allergies  Allergen Reactions   Ozempic [Semaglutide] Diarrhea   Erythromycin Nausea And Vomiting   Lisinopril Itching, Other (See Comments) and Cough    Reaction:  Throat itching     Past Medical History:  Diagnosis Date   Diabetes mellitus without complication (HCC)    Hyperlipidemia    Hypertension    Neuromuscular disorder (HCC)    Neuropathy     Past Surgical History:  Procedure Laterality Date   TUBAL LIGATION  2003    Family History  Problem Relation Age of Onset   Hypertension Mother    Heart disease Mother    Hypertension Father    Hypertension Sister    Hypertension Sister    Diabetes Maternal Grandfather    Diabetes Paternal Grandfather     Social History:  reports that she has never smoked. She has never used smokeless tobacco. She reports that she does not drink alcohol and does not use drugs.   Review of Systems   Lipid history: Has hypercholesterolemia with sometimes high triglycerides also  She is now taking Lipitor 20 mg with LDL just below 100   Lab Results  Component Value Date   CHOL 193 01/28/2023   HDL 57.70  01/28/2023   LDLCALC 99 01/28/2023   LDLDIRECT 128.0 07/21/2021   TRIG 181.0 (H) 01/28/2023   CHOLHDL 3 01/28/2023           Hypertension: Treated by PCP, on losartan 100 mg from her PCP   BP Readings from Last 3 Encounters:  02/01/23 124/80  01/11/23 122/78  11/25/22 136/88    Retinopathy She has eye exams regularly Has proliferative retinopathy and vitrectomy : No reports available  Most recent foot exam: 9/22  Complications of diabetes: Has symptoms of neuropathy, having symptoms of burning and numbness, radiculopathy, possible gastroparesis   Physical Examination:  BP 124/80   Pulse 85   Ht 5\' 2"  (1.575 m)   Wt 202 lb (91.6 kg)   LMP  (LMP Unknown)   SpO2 95%   BMI 36.95 kg/m   No ankle edema present  ASSESSMENT:  Diabetes type 2, insulin resistant with complications  See history of present illness for detailed discussion of current diabetes management, blood sugar patterns and problems identified  A1c is recently 10.2  Current regimen is Humulin R U-500 in the insulin pump which was started about a month ago  She is doing much better with her level of control using the pump compared to the insulin injections with U-500 insulin As above her time in target is 69% and recent GMI is 6.9 which is the best for her history  She does have some difficulty getting consistent coverage of her meals in the evenings but also occasionally has unexpected higher readings at different times She is not using the activity mode when she is exercise Also probably  from not eating much in the afternoon and working her blood sugar may trend low at times  Also not consistently keeping the automated mode and when this is inactive her blood sugars tend to get low  Lipids: At target with Lipitor 20 mg  PLAN:  Day-to-day management of the insulin pump was reviewed Start using activity mode when she is exercising Basal rates will be reduced to 0.7 through 9 AM and continuing 0.8  during the day for her background settings  She will need to adjust her boluses more consistently especially at dinnertime and may use 7-11 units overall with more for larger meals or more fat content/more carbohydrate Target to be changed to 130 till 5 AM and use 120 between 5 AM and 5 PM, subsequently 110 Make sure she tries to get back in the automated mode more consistently She will discuss changing the settings with the nurse educator  Will send a new prescription for the U-500 insulin vial instead of the pens  Continue statins as before  There are no Patient Instructions on file for this visit.    Reather Littler 02/01/2023, 9:34 AM   Note: This office note was prepared with Dragon voice recognition system technology. Any transcriptional errors that result from this process are unintentional.

## 2023-02-02 ENCOUNTER — Encounter: Payer: Self-pay | Admitting: "Endocrinology

## 2023-02-08 ENCOUNTER — Ambulatory Visit: Payer: Managed Care, Other (non HMO) | Admitting: Nutrition

## 2023-02-28 ENCOUNTER — Other Ambulatory Visit: Payer: Self-pay | Admitting: Endocrinology

## 2023-03-15 ENCOUNTER — Encounter: Payer: Self-pay | Admitting: Endocrinology

## 2023-03-15 ENCOUNTER — Ambulatory Visit: Payer: Managed Care, Other (non HMO) | Admitting: Endocrinology

## 2023-03-15 VITALS — BP 130/80 | HR 95 | Resp 20 | Ht 62.0 in | Wt 201.2 lb

## 2023-03-15 DIAGNOSIS — Z7984 Long term (current) use of oral hypoglycemic drugs: Secondary | ICD-10-CM

## 2023-03-15 DIAGNOSIS — E1165 Type 2 diabetes mellitus with hyperglycemia: Secondary | ICD-10-CM

## 2023-03-15 DIAGNOSIS — E1140 Type 2 diabetes mellitus with diabetic neuropathy, unspecified: Secondary | ICD-10-CM | POA: Diagnosis not present

## 2023-03-15 DIAGNOSIS — Z794 Long term (current) use of insulin: Secondary | ICD-10-CM | POA: Diagnosis not present

## 2023-03-15 DIAGNOSIS — R809 Proteinuria, unspecified: Secondary | ICD-10-CM | POA: Diagnosis not present

## 2023-03-15 DIAGNOSIS — E113599 Type 2 diabetes mellitus with proliferative diabetic retinopathy without macular edema, unspecified eye: Secondary | ICD-10-CM | POA: Diagnosis not present

## 2023-03-15 NOTE — Patient Instructions (Signed)
Try to work on timing of bolus, 30 minutes before meals, can use carb 3-10 based on meal size as discussed.

## 2023-03-15 NOTE — Progress Notes (Signed)
Outpatient Endocrinology Note Tammy Johnell Landowski, MD  03/15/23  Patient's Name: Tammy Chan    DOB: September 02, 1965    MRN: [id number]                                                    REASON OF VISIT: Follow-up of type 2 diabetes mellitus  PCP: Pa, [facility]  HISTORY OF PRESENT ILLNESS:   Tammy Chan is a 57 y.o. old female with past medical history listed below, is here for follow up of type 2 diabetes mellitus.   Pertinent Diabetes History: Patient was diagnosed with type 2 diabetes mellitus in 1996.  She was restarted on insulin therapy at the time of diagnosis.  She had tried some oral medications no detail records available reports did not work effectively including metformin.  Over the years she had been on various insulin regimen including premixed insulin, Lantus and NovoLog.  She was on Humulin R U-500 insulin was taking 100 units 3 times a day, prior to starting insulin pump.  She was started on OmniPod 5 insulin pump in January 10, 2023.  She uses U-500 insulin.  Chronic Diabetes Complications : Retinopathy: yes.  Proliferative retinopathy, following regularly with ophthalmology.  Nephropathy: yes, on losartan. Peripheral neuropathy: yes, on Lyrica. Coronary artery disease: no Stroke: no  Relevant comorbidities and cardiovascular risk factors: Obesity: yes Body mass index is 36.8 kg/m.  Hypertension: yes Hyperlipidemia. Yes, on statin.   Current / Home Diabetic regimen includes: Jardiance 25 mg daily.   OmniPod 5 with Dexcom G6 using U-500 insulin.  Insulin Pump setting:  Basal MN- 0.8u/hour, total 19.2 units.  Bolus CHO Ratio (1unit:CHO), she has been using 3 to 8 of carb count, no exact carb counting. MN- 1:1  Correction/Sensitivity: MN- 1:60  Target: 120  Active insulin time: 6 hours  Prior diabetic medications: U-500 insulin 3 times a day prior to starting pump.  CONTINUOUS GLUCOSE MONITORING SYSTEM (CGMS) / INSULIN PUMP  INTERPRETATION:                         OmniPod Pump & Sensor Download (Reviewed and summarized below.) Pump: Dexcom G6 and Tandem t:slim Dates: August 19 to March 15, 2023.  Sensor Average: 226 SD 84 CGM/Sensor usage:93% Time in range :25%  Glycemic Trends:  <54: 0% 54-70: 0% 71-180: 25% 181-250: 50% 251-400: 25%   Average daily carbs entered: 10.2 Average total daily insulin:  31.6 units, Basal: 68%, Bolus: 32%   Automated mode in Use: 81%, manual mode 19%.   CONTINUOUS GLUCOSE MONITORING SYSTEM (CGMS) INTERPRETATION: At today's visit, we reviewed CGM downloads. The full report is scanned in the media. Reviewing the CGM trends, blood glucose are as follows:  Dexcom G6 CGM-  Sensor Download (Sensor download was reviewed and summarized below.) Dates: September 4 to March 15, 2023  Glucose Management Indicator: 7.5% Sensor Average: 176 SD 61 CGM/Sensor usage:93%  Glycemic Trends:  <54: 0% 54-70: 0% 71-180: 58% 181-250: 33% 251-400: 9%  Interpretation: -Patient is having relatively better and less variable blood sugar in last 2 weeks compared to in last 30 days.  She has acceptable blood sugar overnight and in between the meals however having significant hyperglycemia with blood sugar up to 300-400 range at times, mostly related with late bolus and inadequate  and no bolus for meals.  She occasionally had bolus for meals prior to eating.  Some of the time she had not bolus for the meals.  She usually eats late meal around 10 to 11 PM after work causing bedtime hyperglycemia extended into overnight hyperglycemia.  No significant hypoglycemia.    Hypoglycemia: Patient has no hypoglycemic episodes. Patient has hypoglycemia awareness.    Factors modifying glucose control: 1.  Diabetic diet assessment: Largest meal dinner late around 10 to 11 PM after work.  She has breakfast and lunch as well.  2.  Staying active or exercising:   3.  Medication compliance:  compliant most of the time.  Interval history 03/15/23 Pump and CGM data as reviewed above.  She is having improvement on her blood sugar control after being on insulin pump.  She has been logging an existing with the pump setting.  She is getting better on bolusing for meals.  GMI on CGM 7.5%.  No other complaints today.  REVIEW OF SYSTEMS As per history of present illness.   PAST MEDICAL HISTORY: Past Medical History:  Diagnosis Date   Diabetes mellitus without complication (HCC)    Hyperlipidemia    Hypertension    Neuromuscular disorder (HCC)    Neuropathy     PAST SURGICAL HISTORY: Past Surgical History:  Procedure Laterality Date   TUBAL LIGATION  2003    ALLERGIES: Allergies  Allergen Reactions   Ozempic [Semaglutide] Diarrhea   Erythromycin Nausea And Vomiting   Lisinopril Itching, Other (See Comments) and Cough    Reaction:  Throat itching     FAMILY HISTORY:  Family History  Problem Relation Age of Onset   Hypertension Mother    Heart disease Mother    Hypertension Father    Hypertension Sister    Hypertension Sister    Diabetes Maternal Grandfather    Diabetes Paternal Grandfather     SOCIAL HISTORY: Social History   Socioeconomic History   Marital status: Single    Spouse name: Not on file   Number of children: 3   Years of education: Not on file   Highest education level: Not on file  Occupational History   Occupation: LPN     Comment: [place]  Tobacco Use   Smoking status: Never   Smokeless tobacco: Never  Vaping Use   Vaping status: Never Used  Substance and Sexual Activity   Alcohol use: No    Comment: socical   Drug use: No   Sexual activity: Not Currently    Birth control/protection: None  Other Topics Concern   Not on file  Social History Narrative   Not on file   Social Determinants of Health   Financial Resource Strain: Not on file  Food Insecurity: Not on file  Transportation Needs: Not on file  Physical  Activity: Not on file  Stress: No Stress Concern Present (04/13/2021)   Received from [facility]   [employer] of Occupational Health - Occupational Stress Questionnaire    Feeling of Stress : Not at all  Social Connections: Unknown (11/10/2021)   Received from [facility], [facility]   Social Network    Social Network: Not on file    MEDICATIONS:  Current Outpatient Medications  Medication Sig Dispense Refill   atorvastatin (LIPITOR) 20 MG tablet Take 1 tablet (20 mg total) by mouth daily. 90 tablet 3   Continuous Glucose Receiver (DEXCOM G6 RECEIVER) DEVI Use to display glucose from sensor 1 each 0  Continuous Glucose Sensor (DEXCOM G6 SENSOR) MISC Change sensor every 10 days 3 each 15   glucose blood (CONTOUR NEXT TEST) test strip 1 each by Other route as needed for other. Use as instructed to check blood sugar 1 time daily 100 each 3   HUMULIN R 500 UNIT/ML injection USE UP TO 0.6 ML DAILY IN INSULIN PUMP 20 mL 0   Insulin Disposable Pump (OMNIPOD 5 G6 PODS, GEN 5,) MISC 1 Device by Does not apply route every 3 (three) days. 2 each 3   Insulin Pen Needle (B-D UF III MINI PEN NEEDLES) 31G X 5 MM MISC USE WITH INSULIN PEN 3 TIMES A DAY 100 each 4   losartan (COZAAR) 100 MG tablet Take by mouth.     metoCLOPramide (REGLAN) 5 MG tablet Take 1 tablet (5 mg total) by mouth 4 (four) times daily -  before meals and at bedtime. 90 tablet 1   pregabalin (LYRICA) 25 MG capsule Take by mouth.     Cetirizine HCl 10 MG CAPS Take by mouth. (Patient not taking: Reported on 03/15/2023)     empagliflozin (JARDIANCE) 25 MG TABS tablet Take 1 tablet (25 mg total) by mouth daily before breakfast. 30 tablet 3   fluticasone (FLONASE) 50 MCG/ACT nasal spray Place 1-2 sprays into both nostrils daily. (Patient not taking: Reported on 03/15/2023) 16 g 0   sucralfate (CARAFATE) 1 GM/10ML suspension Take 10 mLs (1 g total) by mouth 4 (four) times daily -  with meals and at bedtime.  (Patient not taking: Reported on 03/15/2023) 420 mL 0   No current facility-administered medications for this visit.    PHYSICAL EXAM: Vitals:   03/15/23 1005  BP: 130/80  Pulse: 95  Resp: 20  SpO2: 96%  Weight: 201 lb 3.2 oz (91.3 kg)  Height: 5\' 2"  (1.575 m)   Body mass index is 36.8 kg/m.  Wt Readings from Last 3 Encounters:  03/15/23 201 lb 3.2 oz (91.3 kg)  02/01/23 202 lb (91.6 kg)  01/11/23 197 lb (89.4 kg)    General: Well developed, well nourished female in no apparent distress.  HEENT: AT/[place], no external lesions.  Eyes: Conjunctiva clear and no icterus. Neck: Neck supple  Lungs: Respirations not labored Neurologic: Alert, oriented, normal speech Extremities / Skin: Dry. No sores or rashes noted. No acanthosis nigricans Psychiatric: Does not appear depressed or anxious  Diabetic Foot Exam - Simple   No data filed    LABS Reviewed Lab Results  Component Value Date   HGBA1C 10.2 (H) 01/28/2023   HGBA1C 10.7 (A) 01/11/2023   HGBA1C 14.8 (H) 09/17/2022   Lab Results  Component Value Date   FRUCTOSAMINE 391 (H) 11/17/2022   FRUCTOSAMINE 525 (H) 04/24/2021   FRUCTOSAMINE 430 (H) 04/27/2019   Lab Results  Component Value Date   CHOL 193 01/28/2023   HDL 57.70 01/28/2023   LDLCALC 99 01/28/2023   LDLDIRECT 128.0 07/21/2021   TRIG 181.0 (H) 01/28/2023   CHOLHDL 3 01/28/2023   Lab Results  Component Value Date   MICRALBCREAT 60 (H) 09/17/2022   MICRALBCREAT 2.7 03/10/2021   Lab Results  Component Value Date   CREATININE 0.59 01/28/2023   Lab Results  Component Value Date   GFR 98.54 01/28/2023    ASSESSMENT / PLAN  1. Uncontrolled type 2 diabetes mellitus with hyperglycemia, with long-term current use of insulin (HCC)   2. Type 2 diabetes mellitus with proliferative retinopathy, with long-term current use of insulin, macular edema presence  unspecified, unspecified laterality, unspecified proliferative retinopathy* (HCC)   3. Type 2 diabetes  mellitus with diabetic neuropathy, with long-term current use of insulin (HCC)   4. Microalbuminuria     Diabetes Mellitus type 2, complicated by diabetes retinopathy, neuropathy and microalbuminuria. - Diabetic status / severity: Uncontrolled.  Improving.  Lab Results  Component Value Date   HGBA1C 10.2 (H) 01/28/2023    - Hemoglobin A1c goal <7%   No change in pump setting.  However advised patient to increase carb count for mealtime bolus 3 to 10 units, based on meal size.  Advised the send to bolus for meals 30 minutes before.  - Medications:  Jardiance 25 mg daily.   OmniPod 5 with Dexcom G6 using U-500 insulin.  Insulin Pump setting:  Basal MN- 0.8u/hour, total 19.2 units.  Bolus CHO Ratio (1unit:CHO), she has been using 3 to 8 of carb count, no exact carb counting. MN- 1:1  Correction/Sensitivity: MN- 1:60  Target: 120  Active insulin time: 6 hours  - Home glucose testing: continue CGM and check blood glucose as needed.  - Discussed/ Gave Hypoglycemia treatment plan.  # Consult : not required at this time.   # Annual urine for microalbuminuria/ creatinine ratio, no microalbuminuria currently, continue ACE/ARB /losartan. Last  Lab Results  Component Value Date   MICRALBCREAT 60 (H) 09/17/2022    # Foot check nightly / neuropathy, continue Lyrica.  # She has diabetic retinopathy, following with ophthalmology regularly.  - Diet: Make healthy diabetic food choices - Life style / activity / exercise: Discussed.  2. Blood pressure  -  BP Readings from Last 1 Encounters:  03/15/23 130/80    - Control is in target.  - No change in current plans.  3. Lipid status / Hyperlipidemia - Last  Lab Results  Component Value Date   LDLCALC 99 01/28/2023   - Continue atorvastatin 20 mg daily.  Diagnoses and all orders for this visit:  Uncontrolled type 2 diabetes mellitus with hyperglycemia, with long-term current use of insulin (HCC)  Type 2 diabetes  mellitus with proliferative retinopathy, with long-term current use of insulin, macular edema presence unspecified, unspecified laterality, unspecified proliferative retinopathy* (HCC)  Type 2 diabetes mellitus with diabetic neuropathy, with long-term current use of insulin (HCC)  Microalbuminuria    DISPOSITION Follow up in clinic in 2 months suggested.   All questions answered and patient verbalized understanding of the plan.  Tammy Edyth Glomb, MD [facility] Endocrinology [facility] [address] [place], [address] [place], [place] [address] Phone # [phone]  At least part of this note was generated using voice recognition software. Inadvertent word errors may have occurred, which were not recognized during the proofreading process.

## 2023-03-16 ENCOUNTER — Encounter: Payer: Self-pay | Admitting: Endocrinology

## 2023-04-14 ENCOUNTER — Other Ambulatory Visit: Payer: Self-pay | Admitting: Endocrinology

## 2023-04-15 ENCOUNTER — Telehealth: Payer: Self-pay

## 2023-04-15 NOTE — Telephone Encounter (Signed)
PA needed for Omnipod 5 and Dexcom 6 per patient insurance changed

## 2023-04-18 ENCOUNTER — Telehealth: Payer: Self-pay

## 2023-04-18 ENCOUNTER — Other Ambulatory Visit (HOSPITAL_COMMUNITY): Payer: Self-pay

## 2023-04-18 NOTE — Telephone Encounter (Signed)
Pharmacy Patient Advocate Encounter   Received notification from Pt Calls Messages that prior authorization for Omnipod 5 DexG7G6 Pods Gen 5 is required/requested.  Per test claim: PA required; PA submitted to [facility] via CoverMyMeds Key/confirmation #/EOC [facility] Status is pending

## 2023-04-18 NOTE — Telephone Encounter (Signed)
Pharmacy Patient Advocate Encounter   Received notification from Pt Calls Messages that prior authorization for Dexcom g6 sensor is required/requested.   Per test claim: PA required; PA submitted to [facility] via CoverMyMeds Key/confirmation #/[id number] Status is pending

## 2023-04-19 ENCOUNTER — Telehealth: Payer: Self-pay | Admitting: Endocrinology

## 2023-04-19 NOTE — Telephone Encounter (Signed)
Patient came in and picked up an OMNI POD sample 04/19/23

## 2023-04-22 NOTE — Telephone Encounter (Signed)
Pharmacy Patient Advocate Encounter  Received notification from [facility] that Prior Authorization for Omnipod has been DENIED.  Full denial letter will be uploaded to the media tab. See denial reason below.   PA #/Case ID/Reference #: [id number]

## 2023-04-22 NOTE — Telephone Encounter (Signed)
Pharmacy Patient Advocate Encounter  Received notification from [facility] that Prior Authorization for Dexcom G6 sensor has been DENIED.  Full denial letter will be uploaded to the media tab. See denial reason below.   PA #/Case ID/Reference #:  [id number]

## 2023-05-16 ENCOUNTER — Encounter: Payer: Self-pay | Admitting: Endocrinology

## 2023-05-16 ENCOUNTER — Ambulatory Visit (INDEPENDENT_AMBULATORY_CARE_PROVIDER_SITE_OTHER): Payer: Self-pay | Admitting: Endocrinology

## 2023-05-16 VITALS — BP 126/80 | HR 91 | Resp 20 | Ht 62.0 in | Wt 199.2 lb

## 2023-05-16 DIAGNOSIS — Z7984 Long term (current) use of oral hypoglycemic drugs: Secondary | ICD-10-CM

## 2023-05-16 DIAGNOSIS — Z794 Long term (current) use of insulin: Secondary | ICD-10-CM

## 2023-05-16 DIAGNOSIS — E1165 Type 2 diabetes mellitus with hyperglycemia: Secondary | ICD-10-CM

## 2023-05-16 LAB — POCT GLYCOSYLATED HEMOGLOBIN (HGB A1C): Hemoglobin A1C: 9.1 % — AB (ref 4.0–5.6)

## 2023-05-16 MED ORDER — FREESTYLE LIBRE 3 PLUS SENSOR MISC: 1.0000 | 3 refills | Status: DC

## 2023-05-16 MED ORDER — DEXCOM G6 SENSOR MISC
3 refills | Status: DC
Start: 2023-05-16 — End: 2023-11-14

## 2023-05-16 MED ORDER — HUMULIN R U-500 KWIKPEN 500 UNIT/ML ~~LOC~~ SOPN: 50.0000 [IU] | PEN_INJECTOR | Freq: Three times a day (TID) | SUBCUTANEOUS | 3 refills | Status: DC

## 2023-05-16 NOTE — Progress Notes (Signed)
Outpatient Endocrinology Note Tammy Beyonka Pitney, MD  05/16/23  Patient's Name: Tammy Chan    DOB: [birth year]    MRN: [id number]                                                    REASON OF VISIT: Follow-up of type 2 diabetes mellitus  PCP: [facility], [facility]  HISTORY OF PRESENT ILLNESS:   Tammy Chan is a 57 y.o. old female with past medical history listed below, is here for follow up of type 2 diabetes mellitus.   Pertinent Diabetes History: Patient was diagnosed with type 2 diabetes mellitus in 1996.  She was restarted on insulin therapy at the time of diagnosis.  She had tried some oral medications no detail records available reports did not work effectively including metformin.  Over the years she had been on various insulin regimen including premixed insulin, Lantus and NovoLog.  She was on Humulin R U-500 insulin was taking 100 units 3 times a day, prior to starting insulin pump.  She was started on OmniPod 5 insulin pump in January 10, 2023.  She uses U-500 insulin.  Chronic Diabetes Complications : Retinopathy: yes.  Proliferative retinopathy, following regularly with ophthalmology.  Nephropathy: yes, on losartan. Peripheral neuropathy: yes, on Lyrica. Coronary artery disease: no Stroke: no  Relevant comorbidities and cardiovascular risk factors: Obesity: yes Body mass index is 36.43 kg/m.  Hypertension: yes Hyperlipidemia. Yes, on statin.   Current / Home Diabetic regimen includes: Jardiance 25 mg daily.  Humulin U500 : Using U100 syringe 35 units two times a day.   She was on insulin pump as follows: Until end of October, currently not using insulin pump and Dexcom G6.  OmniPod 5 with Dexcom G6 using U-500 insulin.  Insulin Pump setting:  Basal MN- 0.8u/hour, total 19.2 units.  Bolus CHO Ratio (1unit:CHO), she has been using 3 to 8 of carb count, no exact carb counting. MN- 1:1  Correction/Sensitivity: MN- 1:60  Target: 120  Active  insulin time: 6 hours  Prior diabetic medications: U-500 insulin 100 units 3 times a day prior to starting pump in July.  CONTINUOUS GLUCOSE MONITORING SYSTEM (CGMS) / INSULIN PUMP INTERPRETATION: Currently not using insulin pump.  She stopped using from around the end of October.     While using insulin pump she had used 24.7 units/day with 64% basal and 36% bolus using U-500 insulin.  88% automated mode.  He used 3-5 as carb count.  Bolusing 2-4 times a day, postprandial hyperglycemia related with late bolus and inadequate meal bolus.  No hypoglycemia.                 CONTINUOUS GLUCOSE MONITORING SYSTEM (CGMS) INTERPRETATION: At today's visit, we reviewed CGM downloads. The full report is scanned in the media. Reviewing the CGM trends, blood glucose are as follows:  Dexcom G6 CGM-  Sensor Download (Sensor download was reviewed and summarized below.) Dates: October 19 to April 29, 2023 , 14 days  Glucose Management Indicator: n/a% Sensor Average: 258 SD 57 CGM/Sensor usage:93%  Glycemic Trends:  <54: 0% 54-70: 0% 71-180: 64% 181-250: 28% 251-400: 7%  Interpretation: Patient has frequent hyperglycemia related with meals mainly with lunch, supper and at bedtime of the range of 300s.  Early morning and early afternoon blood sugar are mostly acceptable.  No significant hypoglycemia.  Hypoglycemia: Patient has no hypoglycemic episodes. Patient has hypoglycemia awareness.    Factors modifying glucose control: 1.  Diabetic diet assessment: Largest meal dinner late around 10 to 11 PM after work.  She has breakfast and lunch as well.  2.  Staying active or exercising:   3.  Medication compliance: compliant most of the time.  Interval history  Patient is stopped using insulin pump from the end of October.  OmniPod and Dexcom CGM hide not cover by the current insurance.  She had a change of the job and has new insurance.  She is currently doing U500 insulin by U100 syringe, taking  35 units 2 times a day.  Dexcom data as reviewed above.  She has not checked blood sugar much after being off of the insulin pump.  She is mostly taking once a day at work, no data available to review.  She has a new job and will have new insurance plan from the month of December.  No other complaints today.  REVIEW OF SYSTEMS As per history of present illness.   PAST MEDICAL HISTORY: Past Medical History:  Diagnosis Date   Diabetes mellitus without complication (HCC)    Hyperlipidemia    Hypertension    Neuromuscular disorder (HCC)    Neuropathy     PAST SURGICAL HISTORY: Past Surgical History:  Procedure Laterality Date   TUBAL LIGATION  2003    ALLERGIES: Allergies  Allergen Reactions   Ozempic [Semaglutide] Diarrhea   Erythromycin Nausea And Vomiting   Lisinopril Itching, Other (See Comments) and Cough    Reaction:  Throat itching     FAMILY HISTORY:  Family History  Problem Relation Age of Onset   Hypertension Mother    Heart disease Mother    Hypertension Father    Hypertension Sister    Hypertension Sister    Diabetes Maternal Grandfather    Diabetes Paternal Grandfather     SOCIAL HISTORY: Social History   Socioeconomic History   Marital status: Single    Spouse name: Not on file   Number of children: 3   Years of education: Not on file   Highest education level: Not on file  Occupational History   Occupation: LPN     Comment: [place]  Tobacco Use   Smoking status: Never   Smokeless tobacco: Never  Vaping Use   Vaping status: Never Used  Substance and Sexual Activity   Alcohol use: No    Comment: socical   Drug use: No   Sexual activity: Not Currently    Birth control/protection: None  Other Topics Concern   Not on file  Social History Narrative   Not on file   Social Determinants of Health   Financial Resource Strain: Not on file  Food Insecurity: Not on file  Transportation Needs: Not on file  Physical Activity: Not  on file  Stress: No Stress Concern Present (04/13/2021)   Received from [facility], [facility]   [employer] [facility] - Occupational Stress Questionnaire    Feeling of Stress : Not at all  Social Connections: Unknown (11/10/2021)   Received from [facility], [facility]   Social Network    Social Network: Not on file    MEDICATIONS:  Current Outpatient Medications  Medication Sig Dispense Refill   atorvastatin (LIPITOR) 20 MG tablet Take 1 tablet (20 mg total) by mouth daily. 90 tablet 3   Cetirizine HCl 10 MG CAPS Take by mouth.  Continuous Glucose Receiver (DEXCOM G6 RECEIVER) DEVI Use to display glucose from sensor 1 each 0   Continuous Glucose Sensor (FREESTYLE LIBRE 3 PLUS SENSOR) MISC 1 each by Does not apply route continuous. Change every 15 days. 6 each 3   empagliflozin (JARDIANCE) 25 MG TABS tablet Take 1 tablet (25 mg total) by mouth daily before breakfast. 30 tablet 3   fluticasone (FLONASE) 50 MCG/ACT nasal spray Place 1-2 sprays into both nostrils daily. 16 g 0   glucose blood (CONTOUR NEXT TEST) test strip 1 each by Other route as needed for other. Use as instructed to check blood sugar 1 time daily 100 each 3   HUMULIN R 500 UNIT/ML injection USE UP TO 0.6 ML DAILY IN INSULIN PUMP 20 mL 0   Insulin Disposable Pump (OMNIPOD 5 G6 PODS, GEN 5,) MISC 1 Device by Does not apply route every 3 (three) days. 2 each 3   Insulin Pen Needle (B-D UF III MINI PEN NEEDLES) 31G X 5 MM MISC USE WITH INSULIN PEN 3 TIMES A DAY 100 each 4   insulin regular human CONCENTRATED (HUMULIN R U-500 KWIKPEN) 500 UNIT/ML KwikPen Inject 50 Units into the skin 3 (three) times daily with meals. 30 mL 3   losartan (COZAAR) 100 MG tablet Take by mouth.     metoCLOPramide (REGLAN) 5 MG tablet Take 1 tablet (5 mg total) by mouth 4 (four) times daily -  before meals and at bedtime. 90 tablet 1   pregabalin (LYRICA) 25 MG capsule Take by mouth.     sucralfate (CARAFATE) 1  GM/10ML suspension Take 10 mLs (1 g total) by mouth 4 (four) times daily -  with meals and at bedtime. 420 mL 0   Continuous Glucose Sensor (DEXCOM G6 SENSOR) MISC Change sensor every 10 days 9 each 3   No current facility-administered medications for this visit.    PHYSICAL EXAM: Vitals:   05/16/23 1041  BP: 126/80  Pulse: 91  Resp: 20  SpO2: 97%  Weight: 199 lb 3.2 oz (90.4 kg)  Height: 5\' 2"  (1.575 m)   Body mass index is 36.43 kg/m.  Wt Readings from Last 3 Encounters:  05/16/23 199 lb 3.2 oz (90.4 kg)  03/15/23 201 lb 3.2 oz (91.3 kg)  02/01/23 202 lb (91.6 kg)    General: Well developed, well nourished female in no apparent distress.  HEENT: AT/[place], no external lesions.  Eyes: Conjunctiva clear and no icterus. Neck: Neck supple  Lungs: Respirations not labored Neurologic: Alert, oriented, normal speech Extremities / Skin: Dry. No sores or rashes noted.  Psychiatric: Does not appear depressed or anxious  Diabetic Foot Exam - Simple   No data filed    LABS Reviewed Lab Results  Component Value Date   HGBA1C 9.1 (A) 05/16/2023   HGBA1C 10.2 (H) 01/28/2023   HGBA1C 10.7 (A) 01/11/2023   Lab Results  Component Value Date   FRUCTOSAMINE 391 (H) 11/17/2022   FRUCTOSAMINE 525 (H) 04/24/2021   FRUCTOSAMINE 430 (H) 04/27/2019   Lab Results  Component Value Date   CHOL 193 01/28/2023   HDL 57.70 01/28/2023   LDLCALC 99 01/28/2023   LDLDIRECT 128.0 07/21/2021   TRIG 181.0 (H) 01/28/2023   CHOLHDL 3 01/28/2023   Lab Results  Component Value Date   MICRALBCREAT 60 (H) 09/17/2022   MICRALBCREAT 2.7 03/10/2021   Lab Results  Component Value Date   CREATININE 0.59 01/28/2023   Lab Results  Component Value Date   GFR 98.54  01/28/2023    ASSESSMENT / PLAN  1. Uncontrolled type 2 diabetes mellitus with hyperglycemia, with long-term current use of insulin (HCC)     Diabetes Mellitus type 2, complicated by diabetes retinopathy, neuropathy and  microalbuminuria. - Diabetic status / severity: Uncontrolled..  Lab Results  Component Value Date   HGBA1C 9.1 (A) 05/16/2023    - Hemoglobin A1c goal <7%   Patient has been off of insulin pump, not covered by current insurance.  She will have new medical insurance from the month of December.  Patient will check with her new insurance next month in regard to coverage for OmniPod and Dexcom CGM.  Based on insulin pump data and CGM data review, she has been using about 25 Units via pump per day of U-500 insulin.  For now adjusted insulin /diabetes regimen as follows.  - Medications:  Continue Jardiance 25 mg daily.   Adjust insulin U-500 Humulin 50 units with meals 2-3 times a day.  If uses U100 syringe take 10 units with meals 2-3 times a day.  - Home glucose testing: continue CGM and check blood glucose as needed.  Sent prescription for freestyle libre 3+. - Discussed/ Gave Hypoglycemia treatment plan.  Plan is to go back to insulin pump once covered by her insurance.  # Consult : not required at this time.   # Annual urine for microalbuminuria/ creatinine ratio, microalbuminuria currently, continue ACE/ARB /losartan. Last  Lab Results  Component Value Date   MICRALBCREAT 60 (H) 09/17/2022    # Foot check nightly / neuropathy, continue Lyrica.  # She has diabetic retinopathy, following with ophthalmology regularly.  - Diet: Make healthy diabetic food choices - Life style / activity / exercise: Discussed.  2. Blood pressure  -  BP Readings from Last 1 Encounters:  05/16/23 126/80    - Control is in target.  - No change in current plans.  3. Lipid status / Hyperlipidemia - Last  Lab Results  Component Value Date   LDLCALC 99 01/28/2023   - Continue atorvastatin 20 mg daily.  Diagnoses and all orders for this visit:  Uncontrolled type 2 diabetes mellitus with hyperglycemia, with long-term current use of insulin (HCC) -     POCT glycosylated hemoglobin (Hb  A1C) -     Continuous Glucose Sensor (DEXCOM G6 SENSOR) MISC; Change sensor every 10 days  Other orders -     Continuous Glucose Sensor (FREESTYLE LIBRE 3 PLUS SENSOR) MISC; 1 each by Does not apply route continuous. Change every 15 days. -     insulin regular human CONCENTRATED (HUMULIN R U-500 KWIKPEN) 500 UNIT/ML KwikPen; Inject 50 Units into the skin 3 (three) times daily with meals.    DISPOSITION Follow up in clinic in 2 months suggested.  Patient is asked to call our clinic with any updates in regard to her medical insurance.   All questions answered and patient verbalized understanding of the plan.  Tammy Jaise Moser, MD [facility] [address] [place], [address] [place], [place] [address] Phone # [phone]  At least part of this note was generated using voice recognition software. Inadvertent word errors may have occurred, which were not recognized during the proofreading process.

## 2023-05-16 NOTE — Patient Instructions (Signed)
Humulin U500 pen 50 units with meals 2-3 times a day.  Humulin U500, on U100 syringe take 10 units with meals 2-3 times a day.  Jardiance 25 mg daily.

## 2023-05-17 ENCOUNTER — Telehealth: Payer: Self-pay

## 2023-05-17 ENCOUNTER — Other Ambulatory Visit (HOSPITAL_COMMUNITY): Payer: Self-pay

## 2023-05-17 NOTE — Telephone Encounter (Signed)
Pharmacy Patient Advocate Encounter   Received notification from CoverMyMeds that prior authorization for Freestyle libre 3 plus is required/requested.   Insurance verification completed.   The patient is insured through [facility] .   Per test claim: PA required; PA submitted to above mentioned insurance via CoverMyMeds Key/confirmation #/EOC [id number] Status is pending

## 2023-05-23 NOTE — Telephone Encounter (Signed)
Pharmacy Patient Advocate Encounter  Received notification from [facility] that Prior Authorization for [facility] 3 plus sensor has been DENIED.  Full denial letter will be uploaded to the media tab. See denial reason below.    PA #/Case ID/Reference #: [id number]

## 2023-05-23 NOTE — Telephone Encounter (Signed)
Chart notes and labs were submitted with prior auth. I am assuming this is the part of the chart note they are pulling the information from:    It seems they are reading chart notes more thoroughly.

## 2023-07-26 ENCOUNTER — Ambulatory Visit: Payer: Self-pay | Admitting: Endocrinology

## 2023-07-28 ENCOUNTER — Ambulatory Visit: Payer: Self-pay | Admitting: Endocrinology

## 2023-08-30 ENCOUNTER — Encounter: Payer: Self-pay | Admitting: Endocrinology

## 2023-08-30 ENCOUNTER — Ambulatory Visit: Payer: Self-pay | Admitting: Endocrinology

## 2023-08-30 VITALS — BP 132/80 | HR 96 | Ht 62.0 in | Wt 181.0 lb

## 2023-08-30 DIAGNOSIS — E1165 Type 2 diabetes mellitus with hyperglycemia: Secondary | ICD-10-CM | POA: Diagnosis not present

## 2023-08-30 DIAGNOSIS — Z794 Long term (current) use of insulin: Secondary | ICD-10-CM

## 2023-08-30 LAB — POCT GLYCOSYLATED HEMOGLOBIN (HGB A1C): HbA1c POC (<> result, manual entry): >15 % (ref 4.0–5.6)

## 2023-08-30 MED ORDER — OMNIPOD 5 DEXG7G6 PODS GEN 5 MISC
1.0000 | 3 refills | Status: AC
Start: 2023-08-30 — End: ?

## 2023-08-30 MED ORDER — DEXCOM G7 SENSOR MISC: 1.0000 | 0 refills | Status: DC

## 2023-08-30 MED ORDER — HUMULIN R U-500 KWIKPEN 500 UNIT/ML ~~LOC~~ SOPN: 80.0000 [IU] | PEN_INJECTOR | Freq: Three times a day (TID) | SUBCUTANEOUS | 3 refills | Status: DC

## 2023-08-30 MED ORDER — CONTOUR NEXT TEST VI STRP: 1.0000 | ORAL_STRIP | Freq: Four times a day (QID) | 3 refills | Status: AC

## 2023-08-30 MED ORDER — LANCETS MISC. MISC
1.0000 | Freq: Four times a day (QID) | 3 refills | Status: AC
Start: 2023-08-30 — End: 2024-08-29

## 2023-08-30 NOTE — Progress Notes (Signed)
 Outpatient Endocrinology Note Tammy Rosina Cressler, MD  08/30/23  Patient's Name: Tammy Chan    DOB: 1965-11-17    MRN: [id number]                                                    REASON OF VISIT: Follow-up of type 2 diabetes mellitus  PCP: [facility], [facility]  HISTORY OF PRESENT ILLNESS:   Tammy Chan is a 58 y.o. old female with past medical history listed below, is here for follow up of type 2 diabetes mellitus.   Pertinent Diabetes History: Patient was diagnosed with type 2 diabetes mellitus in 1996.  She was started on insulin therapy at the time of diagnosis.  She had tried some oral medications no detail records available reports did not work effectively including metformin.  Over the years she had been on various insulin regimen including premixed insulin, Lantus and NovoLog.  She was on Humulin R U-500 insulin was taking 100 units 3 times a day, prior to starting insulin pump.  She was started on OmniPod 5 insulin pump in January 10, 2023.  She used U-500 insulin.  She stopped using OmniPod 5 from the end of October 2024, was not covered by medical insurance.  Chronic Diabetes Complications : Retinopathy: yes.  Proliferative retinopathy, following regularly with ophthalmology.  Nephropathy: yes, on losartan. Peripheral neuropathy: yes, on Lyrica. Coronary artery disease: no Stroke: no  Relevant comorbidities and cardiovascular risk factors: Obesity: yes Body mass index is 33.11 kg/m.  Hypertension: yes Hyperlipidemia. Yes, on statin.   Current / Home Diabetic regimen includes:  Humulin R 500 insulin pen 70 units with meals 3 times a day.  Jardiance 25 mg daily.   She was on insulin pump as follows: Until end of October, currently not using insulin pump and Dexcom G6.  Prior diabetic medications: U-500 insulin 100 units 3 times a day prior to starting pump in July.  She was on OmniPod 5 with Dexcom G6 from July to October of 2024.  Glycemic data  : Not able to get Dexcom CGM.  She has been checking blood sugar 3-4 times a day, reviewed glucose log she brought into clinic visit today.  Mostly in the range of 150 - 210 range.  Fasting: 250, 210, 202, 208, 201, 186, 180, 156, 168, 201, 198, 202, 196  Afternoon : 225, 190, 192, 190, 186, 170, 188, 180, 180, 170, 188, 178  Bedtime : 170, 160, 162, 164, 170, 158, 180.   Hypoglycemia: Patient has no hypoglycemic episodes. Patient has hypoglycemia awareness.    Factors modifying glucose control: 1.  Diabetic diet assessment: Largest meal dinner late around 10 to 11 PM after work.  She has breakfast and lunch as well.  She works night shift and sleeps during the daytime.  2.  Staying active or exercising:   3.  Medication compliance: compliant most of the time.  Interval history  Patient glucose log reviewed as above.  She has mostly blood sugar in the range of 150-200.  She has a blood sugar checking 4 times a day.  She brought a written paper glucose log.  Her hemoglobin A1c today more than 15%, significantly high compared to her blood sugar lately.  It is possible that she may have better sugar in last few days. Patient has been taking  U-500 insulin with pen 70 units 3 times a day.  She reports compliance.  She has complaints of blurred vision, headache.  She reports her blood sugar are running 400-500 range and recently somewhat improved.  She has not checked if the new medical insurance cover her insulin pump or CGM.  No other complaints today.  REVIEW OF SYSTEMS As per history of present illness.   PAST MEDICAL HISTORY: Past Medical History:  Diagnosis Date   Diabetes mellitus without complication (HCC)    Hyperlipidemia    Hypertension    Neuromuscular disorder (HCC)    Neuropathy     PAST SURGICAL HISTORY: Past Surgical History:  Procedure Laterality Date   TUBAL LIGATION  2003    ALLERGIES: Allergies  Allergen Reactions   Ozempic [Semaglutide] Diarrhea    Erythromycin Nausea And Vomiting   Lisinopril Itching, Other (See Comments) and Cough    Reaction:  Throat itching     FAMILY HISTORY:  Family History  Problem Relation Age of Onset   Hypertension Mother    Heart disease Mother    Hypertension Father    Hypertension Sister    Hypertension Sister    Diabetes Maternal Grandfather    Diabetes Paternal Grandfather     SOCIAL HISTORY: Social History   Socioeconomic History   Marital status: Single    Spouse name: Not on file   Number of children: 3   Years of education: Not on file   Highest education level: Not on file  Occupational History   Occupation: LPN     Comment: [place]  Tobacco Use   Smoking status: Never   Smokeless tobacco: Never  Vaping Use   Vaping status: Never Used  Substance and Sexual Activity   Alcohol use: No    Comment: socical   Drug use: No   Sexual activity: Not Currently    Birth control/protection: None  Other Topics Concern   Not on file  Social History Narrative   Not on file   Social Drivers of Health   Financial Resource Strain: Not on file  Food Insecurity: Not on file  Transportation Needs: Not on file  Physical Activity: Not on file  Stress: No Stress Concern Present (04/13/2021)   Received from [facility], [facility]   [employer] [facility] - Occupational Stress Questionnaire    Feeling of Stress : Not at all  Social Connections: Unknown (11/10/2021)   Received from [facility], [facility]   Social Network    Social Network: Not on file    MEDICATIONS:  Current Outpatient Medications  Medication Sig Dispense Refill   atorvastatin (LIPITOR) 20 MG tablet Take 1 tablet (20 mg total) by mouth daily. 90 tablet 3   Cetirizine HCl 10 MG CAPS Take by mouth.     Continuous Glucose Sensor (DEXCOM G7 SENSOR) MISC 1 Device by Does not apply route continuous. 9 each 0   Continuous Glucose Sensor (FREESTYLE LIBRE 3 PLUS SENSOR) MISC 1 each by  Does not apply route continuous. Change every 15 days. 6 each 3   empagliflozin (JARDIANCE) 25 MG TABS tablet Take 1 tablet (25 mg total) by mouth daily before breakfast. 30 tablet 3   fluticasone (FLONASE) 50 MCG/ACT nasal spray Place 1-2 sprays into both nostrils daily. 16 g 0   Insulin Disposable Pump (OMNIPOD 5 DEXG7G6 PODS GEN 5) MISC 1 each by Does not apply route every 3 (three) days. 30 each 3   Insulin Pen Needle (B-D  UF III MINI PEN NEEDLES) 31G X 5 MM MISC USE WITH INSULIN PEN 3 TIMES A DAY 100 each 4   Lancets Misc. MISC 1 each by Does not apply route in the morning, at noon, in the evening, and at bedtime. May substitute to any manufacturer covered by patient's insurance. 300 each 3   losartan (COZAAR) 100 MG tablet Take by mouth.     metoCLOPramide (REGLAN) 5 MG tablet Take 1 tablet (5 mg total) by mouth 4 (four) times daily -  before meals and at bedtime. 90 tablet 1   pregabalin (LYRICA) 25 MG capsule Take by mouth.     sucralfate (CARAFATE) 1 GM/10ML suspension Take 10 mLs (1 g total) by mouth 4 (four) times daily -  with meals and at bedtime. 420 mL 0   Continuous Glucose Receiver (DEXCOM G6 RECEIVER) DEVI Use to display glucose from sensor (Patient not taking: Reported on 08/30/2023) 1 each 0   Continuous Glucose Sensor (DEXCOM G6 SENSOR) MISC Change sensor every 10 days (Patient not taking: Reported on 08/30/2023) 9 each 3   glucose blood (CONTOUR NEXT TEST) test strip 1 each by Other route in the morning, at noon, in the evening, and at bedtime. 300 each 3   HUMULIN R 500 UNIT/ML injection USE UP TO 0.6 ML DAILY IN INSULIN PUMP (Patient not taking: Reported on 08/30/2023) 20 mL 0   Insulin Disposable Pump (OMNIPOD 5 G6 PODS, GEN 5,) MISC 1 Device by Does not apply route every 3 (three) days. (Patient not taking: Reported on 08/30/2023) 2 each 3   insulin regular human CONCENTRATED (HUMULIN R U-500 KWIKPEN) 500 UNIT/ML KwikPen Inject 80 Units into the skin 3 (three) times daily with meals.  30 mL 3   No current facility-administered medications for this visit.    PHYSICAL EXAM: Vitals:   08/30/23 1404  BP: 132/80  Pulse: 96  SpO2: 96%  Weight: 181 lb (82.1 kg)  Height: 5\' 2"  (1.575 m)    Body mass index is 33.11 kg/m.  Wt Readings from Last 3 Encounters:  08/30/23 181 lb (82.1 kg)  05/16/23 199 lb 3.2 oz (90.4 kg)  03/15/23 201 lb 3.2 oz (91.3 kg)    General: Well developed, well nourished female in no apparent distress.  HEENT: AT/[place], no external lesions.  Eyes: Conjunctiva clear and no icterus. Neck: Neck supple  Lungs: Respirations not labored Neurologic: Alert, oriented, normal speech Extremities / Skin: Dry.  Psychiatric: Does not appear depressed or anxious  Diabetic Foot Exam - Simple   Simple Foot Form Diabetic Foot exam was performed with the following findings: Yes 08/30/2023  2:42 PM  Visual Inspection No deformities, no ulcerations, no other skin breakdown bilaterally: Yes Sensation Testing See comments: Yes Pulse Check Comments DP 2 bilaterally. Monofilament exam mildly decreased bilaterally.     LABS Reviewed Lab Results  Component Value Date   HGBA1C >15.0 08/30/2023   HGBA1C 9.1 (A) 05/16/2023   HGBA1C 10.2 (H) 01/28/2023   Lab Results  Component Value Date   FRUCTOSAMINE 391 (H) 11/17/2022   FRUCTOSAMINE 525 (H) 04/24/2021   FRUCTOSAMINE 430 (H) 04/27/2019   Lab Results  Component Value Date   CHOL 193 01/28/2023   HDL 57.70 01/28/2023   LDLCALC 99 01/28/2023   LDLDIRECT 128.0 07/21/2021   TRIG 181.0 (H) 01/28/2023   CHOLHDL 3 01/28/2023   Lab Results  Component Value Date   MICRALBCREAT 60 (H) 09/17/2022   MICRALBCREAT 2.7 03/10/2021   Lab Results  Component Value Date   CREATININE 0.59 01/28/2023   Lab Results  Component Value Date   GFR 98.54 01/28/2023    ASSESSMENT / PLAN  1. Uncontrolled type 2 diabetes mellitus with hyperglycemia, with long-term current use of insulin (HCC)     Diabetes Mellitus  type 2, complicated by diabetes retinopathy, neuropathy and microalbuminuria. - Diabetic status / severity: Uncontrolled..  Lab Results  Component Value Date   HGBA1C >15.0 08/30/2023    - Hemoglobin A1c goal <7%   Patient has significantly worsening diabetes status with hemoglobin A1c of more than 15%.  She brought up glucose log with blood sugar mostly in the range of 150-200 in last few days.  Hemoglobin A1c is significantly higher compared to her blood sugar log.   Adjusted insulin /diabetes regimen as follows.  - Medications:  Continue Jardiance 25 mg daily.   Increase insulin U-500 Humulin 70 units to 80 units with meals times a day.  U-500 insulin pen.  Sent prescription for OmniPod 5 and Dexcom G7 to the pharmacy to check the coverage and cost.  Refer to diabetes educator to restart insulin pump if it is covered.  Patient is asked to call our clinic to set up diabetic educator visit after able to get the insulin pump.  - Home glucose testing: continue CGM if able to refill and check blood glucose 4 times a day before meals and bedtime  - Discussed/ Gave Hypoglycemia treatment plan.  # Consult : Diabetic educator.   # Annual urine for microalbuminuria/ creatinine ratio, microalbuminuria currently, continue ACE/ARB /losartan. Last  Lab Results  Component Value Date   MICRALBCREAT 60 (H) 09/17/2022    # Foot check nightly / neuropathy, continue Lyrica.  # She has diabetic retinopathy, following with ophthalmology regularly.  - Diet: Make healthy diabetic food choices.  Discussed in detail about portion control, limiting carbohydrates. - Life style / activity / exercise: Discussed.  2. Blood pressure  -  BP Readings from Last 1 Encounters:  08/30/23 132/80    - Control is in target.  - No change in current plans.  3. Lipid status / Hyperlipidemia - Last  Lab Results  Component Value Date   LDLCALC 99 01/28/2023   - Continue atorvastatin 20 mg  daily.  Tammy Chan was seen today for follow-up.  Diagnoses and all orders for this visit:  Uncontrolled type 2 diabetes mellitus with hyperglycemia, with long-term current use of insulin (HCC) -     POCT glycosylated hemoglobin (Hb A1C) -     Insulin Disposable Pump (OMNIPOD 5 DEXG7G6 PODS GEN 5) MISC; 1 each by Does not apply route every 3 (three) days. -     Continuous Glucose Sensor (DEXCOM G7 SENSOR) MISC; 1 Device by Does not apply route continuous. -     insulin regular human CONCENTRATED (HUMULIN R U-500 KWIKPEN) 500 UNIT/ML KwikPen; Inject 80 Units into the skin 3 (three) times daily with meals. -     Amb Referral to Nutrition and Diabetic Education -     glucose blood (CONTOUR NEXT TEST) test strip; 1 each by Other route in the morning, at noon, in the evening, and at bedtime. -     Lancets Misc. MISC; 1 each by Does not apply route in the morning, at noon, in the evening, and at bedtime. May substitute to any manufacturer covered by patient's insurance.     DISPOSITION Follow up in clinic in 6 weeks suggested.  Patient is asked to call our clinic  with any updates in regard to her medical insurance.   All questions answered and patient verbalized understanding of the plan.  Tammy Tammy Muscatello, MD [facility] Endocrinology [facility] [address] [place], [address] [place], [place] [address] Phone # [phone]  At least part of this note was generated using voice recognition software. Inadvertent word errors may have occurred, which were not recognized during the proofreading process.

## 2023-08-30 NOTE — Patient Instructions (Signed)
 Humulin U500 pen 80 units with meals 3 times a day.  Jardiance 25 mg daily.   Sent prescription for Omnipod 5 and DEXCOM 7.  Referred to Diabetes educator to restart pump.

## 2023-09-06 ENCOUNTER — Telehealth: Payer: Self-pay | Admitting: Nutrition

## 2023-09-06 ENCOUNTER — Encounter: Attending: Endocrinology | Admitting: Nutrition

## 2023-09-06 DIAGNOSIS — E1165 Type 2 diabetes mellitus with hyperglycemia: Secondary | ICD-10-CM | POA: Insufficient documentation

## 2023-09-06 DIAGNOSIS — Z794 Long term (current) use of insulin: Secondary | ICD-10-CM | POA: Diagnosis not present

## 2023-09-06 NOTE — Telephone Encounter (Signed)
 Prior auth. Needed for omnipod 5s .  She is using 1 pod every 3 days.  Send to [facility] on [address]

## 2023-09-06 NOTE — Patient Instructions (Addendum)
 Note sent to DR. Thappa for lows  Call office on Monday to check on prior auth.for pods Call office any time blood sugars are dropping more than once a week at the same time.

## 2023-09-06 NOTE — Telephone Encounter (Signed)
 I am seeing Tammy Chan today.  She is not on her pump as yet.  WE have just started a prior auth for her OmniPod 5 pods.  She is currently on U-500 R insulin 80u TID,  She says FBS ( 3PM-she works nights) is 50-70 almost ever day.  She is then high in the 300s-400s the rest of the day and all night.  Meals are all balanced and not eating more than 30-45g of carb., nothing sweet and no fruit or juices.   Pt. Very discouraged.  Please advise.  Send message to her via your CMA because I have to leave shortly.  Thank you

## 2023-09-06 NOTE — Progress Notes (Signed)
 Patient was trained on use of the Dexcom G7 sensor.  App downloaded to her phone and linked to clarity. Not on her insulin pump.  Taking U500 R 80u TID.  Sometimes not waiting 30-45 minutes after taking injection before eating.  Stressed the need for this.  She wants to go back on her pump, but is waiting for prior auth.   Says [facility] told us it was needed.  No note in chart.  Message sent to [facility] to start this.  Meals are balance.  Works nights.  Gets up at 3PM eats 3 ounces protein 30-45 carbs and 3 veg.  Lunch is at 9PM.  Will have leftover lunch or salad with protein and cheese and 1 piece of bread.  .  CGM:  Dexcom G6 that ended 2 days ago.  Did not bring reader.  Says FBSs are in the low 50s-70 at 3PM when she wakes up and she is shakey and sweaty.  Treats with 6 ounces regular soda.  Note to

## 2023-09-07 ENCOUNTER — Telehealth: Payer: Self-pay

## 2023-09-07 NOTE — Telephone Encounter (Signed)
 Patient mostly has hyperglycemia during eating time.  I would recommend to increase U-500 insulin from 80 units to 90 units for the first and second doses and the third dose I would recommend to decrease from 80 units to 70 units to avoid fasting hypoglycemia when she wakes up.  Please notify the above regimen of insulin for the patient.  Iraq Brunette Lavalle, MD [facility] Endocrinology [facility] [address] [place], [address] [place], [place] [address] Phone # [phone]

## 2023-09-07 NOTE — Telephone Encounter (Signed)
 PA needed for Omnipods

## 2023-09-08 ENCOUNTER — Telehealth: Payer: Self-pay

## 2023-09-08 NOTE — Telephone Encounter (Signed)
 Patient called to give results, message left yesterday.

## 2023-09-09 NOTE — Telephone Encounter (Signed)
 Patient given  medication changes as directed by MD. No further questions at this time per patient

## 2023-09-12 ENCOUNTER — Telehealth: Payer: Self-pay

## 2023-09-12 ENCOUNTER — Other Ambulatory Visit (HOSPITAL_COMMUNITY): Payer: Self-pay

## 2023-09-12 NOTE — Telephone Encounter (Signed)
 Pharmacy Patient Advocate Encounter   Received notification from Pt Calls Messages that prior authorization for Omnipod is required/requested.   Insurance verification completed.   The patient is insured through [facility] .   Per test claim: PA required; PA submitted to above mentioned insurance via CoverMyMeds Key/confirmation #/EOC [id number] Status is pending

## 2023-09-13 NOTE — Telephone Encounter (Signed)
 Pharmacy Patient Advocate Encounter  Received notification from [facility] that Prior Authorization for Omnipod has been APPROVED through 09/11/2024   PA #/Case ID/Reference #: [id number]

## 2023-10-03 ENCOUNTER — Encounter: Attending: Endocrinology | Admitting: Nutrition

## 2023-10-03 DIAGNOSIS — Z794 Long term (current) use of insulin: Secondary | ICD-10-CM | POA: Insufficient documentation

## 2023-10-03 DIAGNOSIS — E1165 Type 2 diabetes mellitus with hyperglycemia: Secondary | ICD-10-CM | POA: Insufficient documentation

## 2023-10-03 NOTE — Progress Notes (Unsigned)
 Patient is here to set up her new PDM.  We tried to open her PDM, but could not log on using new password.  Help line was called.  After one hour with them, the PDM will need to be replaced.  They are sending a new PDM to her.  She was given the user name:  [email or web address] and password: [email or web address] to log in.  She will call me from her home tomorrow after receiving it to help with putting in her settings.

## 2023-10-04 NOTE — Patient Instructions (Signed)
 Call when new PDM comes in to set an appointment to put the settings in.

## 2023-10-05 ENCOUNTER — Encounter: Admitting: Nutrition

## 2023-10-05 DIAGNOSIS — Z794 Long term (current) use of insulin: Secondary | ICD-10-CM | POA: Diagnosis not present

## 2023-10-05 DIAGNOSIS — E1165 Type 2 diabetes mellitus with hyperglycemia: Secondary | ICD-10-CM | POA: Diagnosis not present

## 2023-10-05 NOTE — Progress Notes (Addendum)
 Patient received her new PDM yesterday afternoon.  She is here today to restart her pump using U500R insulin.  Settings were taken from her last office note with Dr. Aretha Kubas on 05/16/23:  Basal rate: 0.8u/hr, I/C: 1 (she was taking 5u acB, 10u acL, and 12u acS), ISF: 60, target: 120 with corrections over 120, timing: 6 hours.  She filled a pod (1.71ml) and attached the pod to her left abdomen.  The dexcom was linked to her PDM and we reviewed again how to bolus, making sure she filles in both the insulin (carb) box as well as the (bg)box with her blood sugar readings.  She says she will do this and had no final questions. Her PDM was linked to [facility]:  user name: [email or web address]  PW: [email or web address]

## 2023-10-05 NOTE — Patient Instructions (Signed)
 Call if blood sugars remain over 250, or drop below 70.

## 2023-10-11 ENCOUNTER — Ambulatory Visit: Admitting: Endocrinology

## 2023-10-17 ENCOUNTER — Ambulatory Visit: Admitting: Endocrinology

## 2023-11-09 ENCOUNTER — Ambulatory Visit: Admitting: Endocrinology

## 2023-11-14 ENCOUNTER — Encounter: Payer: Self-pay | Admitting: Endocrinology

## 2023-11-14 ENCOUNTER — Ambulatory Visit: Admitting: Endocrinology

## 2023-11-14 VITALS — BP 150/80 | HR 92 | Resp 20 | Ht 62.0 in | Wt 206.2 lb

## 2023-11-14 DIAGNOSIS — E1165 Type 2 diabetes mellitus with hyperglycemia: Secondary | ICD-10-CM

## 2023-11-14 DIAGNOSIS — Z794 Long term (current) use of insulin: Secondary | ICD-10-CM | POA: Diagnosis not present

## 2023-11-14 NOTE — Progress Notes (Signed)
 Outpatient Endocrinology Note Tammy Zanyia Silbaugh, MD  11/14/23  Patient's Name: Tammy Chan    DOB: February 12, 1966    MRN: [id number]                                                    REASON OF VISIT: Follow-up of type 2 diabetes mellitus  PCP: [facility], [facility]  HISTORY OF PRESENT ILLNESS:   Tammy Chan is a 58 y.o. old female with past medical history listed below, is here for follow up of type 2 diabetes mellitus.   Pertinent Diabetes History: Patient was diagnosed with type 2 diabetes mellitus in 1996.  She was started on insulin  therapy at the time of diagnosis.  She had tried some oral medications no detail records available reports did not work effectively including metformin .  Over the years she had been on various insulin  regimen including premixed insulin , Lantus and NovoLog .  She was on Humulin  R U-500 insulin  was taking 100 units 3 times a day, prior to starting insulin  pump.  She was started on OmniPod 5 insulin  pump in January 10, 2023.  She used U-500 insulin .  She stopped using OmniPod 5 from the end of October 2024, was not covered by medical insurance.  Chronic Diabetes Complications : Retinopathy: yes.  Proliferative retinopathy, following regularly with ophthalmology.  Nephropathy: yes, on losartan. Peripheral neuropathy: yes, on Lyrica. Coronary artery disease: no Stroke: no  Relevant comorbidities and cardiovascular risk factors: Obesity: yes Body mass index is 37.71 kg/m.  Hypertension: yes Hyperlipidemia. Yes, on statin.   Current / Home Diabetic regimen includes:  OmniPod 5 with Dexcom G7 using insulin  Humulin  R U500  Insulin  pump setting: Basal rates ( 19.2 units/day) 12 AM: 0.8 units/h  Carb ratio 12 AM: 1:1  Sensitivity/correction factor:  12 AM: 1:60  Target : 120  Active insulin  time 6 hours  Jardiance  25 mg daily.   OmniPod 5 Pump & Sensor Download (Reviewed and summarized below.) Dates: May 6 of Nov 14, 2023, 14 days.     Average daily carbs entered: 8 g / day Average total daily insulin :  30.7 units, Basal: 68%, Food Bolus: 32%.     Trends:  Frequent hyperglycemia especially at night due to meals and snacks.  Hyperglycemia during the day as well due to meals.  Hyperglycemia in the range of 250-3 150s.  Hyperglycemia related to frequent snacks and not enough meal boluses.  Some of the times acceptable blood sugar in the early morning and in the afternoon.  She works night shift.  No concerning hypoglycemia.  Hypoglycemia: Patient has no hypoglycemic episodes. Patient has hypoglycemia awareness.    Factors modifying glucose control: 1.  Diabetic diet assessment: Largest meal dinner late around 10 to 11 PM after work.  She has breakfast and lunch as well.  She works night shift and sleeps during the daytime.  2.  Staying active or exercising:   3.  Medication compliance: compliant most of the time.  Interval history  Patient has been on insulin  pump started after the last visit in March, is liking it and is covered by her medical insurance.  CGM data as reviewed above.  Still with frequent hyperglycemia related with snacks and meals.  She has been using Humulin  R U-500 insulin  into the pump.  No other complaints today.  REVIEW  OF SYSTEMS As per history of present illness.   PAST MEDICAL HISTORY: Past Medical History:  Diagnosis Date   Diabetes mellitus without complication (HCC)    Hyperlipidemia    Hypertension    Neuromuscular disorder (HCC)    Neuropathy     PAST SURGICAL HISTORY: Past Surgical History:  Procedure Laterality Date   TUBAL LIGATION  2003    ALLERGIES: Allergies  Allergen Reactions   Ozempic [Semaglutide] Diarrhea   Erythromycin Nausea And Vomiting   Lisinopril Itching, Other (See Comments) and Cough    Reaction:  Throat itching     FAMILY HISTORY:  Family History  Problem Relation Age of Onset   Hypertension Mother    Heart disease Mother    Hypertension  Father    Hypertension Sister    Hypertension Sister    Diabetes Maternal Grandfather    Diabetes Paternal Grandfather     SOCIAL HISTORY: Social History   Socioeconomic History   Marital status: Single    Spouse name: Not on file   Number of children: 3   Years of education: Not on file   Highest education level: Not on file  Occupational History   Occupation: LPN     Comment: [place]  Tobacco Use   Smoking status: Never   Smokeless tobacco: Never  Vaping Use   Vaping status: Never Used  Substance and Sexual Activity   Alcohol use: No    Comment: socical   Drug use: No   Sexual activity: Not Currently    Birth control/protection: None  Other Topics Concern   Not on file  Social History Narrative   Not on file   Social Drivers of Health   Financial Resource Strain: Not on file  Food Insecurity: Not on file  Transportation Needs: Not on file  Physical Activity: Not on file  Stress: No Stress Concern Present (04/13/2021)   Received from [facility], [facility]   [employer] [facility] Occup[facility] - Occupational Stress Questionnaire    Feeling of Stress : Not at all  Social Connections: Unknown (11/10/2021)   Received from [facility], [facility]   Social Network    Social Network: Not on file    MEDICATIONS:  Current Outpatient Medications  Medication Sig Dispense Refill   atorvastatin  (LIPITOR) 20 MG tablet Take 1 tablet (20 mg total) by mouth daily. 90 tablet 3   Cetirizine  HCl 10 MG CAPS Take by mouth.     Continuous Glucose Sensor (DEXCOM G7 SENSOR) MISC 1 Device by Does not apply route continuous. 9 each 0   Continuous Glucose Sensor (FREESTYLE LIBRE 3 PLUS SENSOR) MISC 1 each by Does not apply route continuous. Change every 15 days. 6 each 3   empagliflozin  (JARDIANCE ) 25 MG TABS tablet Take 1 tablet (25 mg total) by mouth daily before breakfast. 30 tablet 3   fluticasone  (FLONASE ) 50 MCG/ACT nasal spray Place 1-2 sprays into  both nostrils daily. 16 g 0   glucose blood (CONTOUR NEXT TEST) test strip 1 each by Other route in the morning, at noon, in the evening, and at bedtime. 300 each 3   HUMULIN  R 500 UNIT/ML injection USE UP TO 0.6 ML DAILY IN INSULIN  PUMP 20 mL 0   Insulin  Disposable Pump (OMNIPOD 5 DEXG7G6 PODS GEN 5) MISC 1 each by Does not apply route every 3 (three) days. 30 each 3   Insulin  Disposable Pump (OMNIPOD 5 G6 PODS, GEN 5,) MISC 1 Device by Does not apply  route every 3 (three) days. 2 each 3   Insulin  Pen Needle (B-D UF III MINI PEN NEEDLES) 31G X 5 MM MISC USE WITH INSULIN  PEN 3 TIMES A DAY 100 each 4   insulin  regular human CONCENTRATED (HUMULIN  R U-500 KWIKPEN) 500 UNIT/ML KwikPen Inject 80 Units into the skin 3 (three) times daily with meals. 30 mL 3   Lancets Misc. MISC 1 each by Does not apply route in the morning, at noon, in the evening, and at bedtime. May substitute to any manufacturer covered by patient's insurance. 300 each 3   losartan (COZAAR) 100 MG tablet Take by mouth.     metoCLOPramide  (REGLAN ) 5 MG tablet Take 1 tablet (5 mg total) by mouth 4 (four) times daily -  before meals and at bedtime. 90 tablet 1   pregabalin (LYRICA) 25 MG capsule Take by mouth.     sucralfate  (CARAFATE ) 1 GM/10ML suspension Take 10 mLs (1 g total) by mouth 4 (four) times daily -  with meals and at bedtime. 420 mL 0   No current facility-administered medications for this visit.    PHYSICAL EXAM: Vitals:   11/14/23 1049 11/14/23 1050  BP: (!) 152/70 (!) 150/80  Pulse: 92   Resp: 20   SpO2: 95%   Weight: 206 lb 3.2 oz (93.5 kg)   Height: 5\' 2"  (1.575 m)      Body mass index is 37.71 kg/m.  Wt Readings from Last 3 Encounters:  11/14/23 206 lb 3.2 oz (93.5 kg)  08/30/23 181 lb (82.1 kg)  05/16/23 199 lb 3.2 oz (90.4 kg)    General: Well developed, well nourished female in no apparent distress.  HEENT: AT/[place], no external lesions.  Eyes: Conjunctiva clear and no icterus. Neck: Neck supple   Lungs: Respirations not labored Neurologic: Alert, oriented, normal speech Extremities / Skin: Dry.  Psychiatric: Does not appear depressed or anxious  Diabetic Foot Exam - Simple   No data filed    LABS Reviewed Lab Results  Component Value Date   HGBA1C >15.0 08/30/2023   HGBA1C 9.1 (A) 05/16/2023   HGBA1C 10.2 (H) 01/28/2023   Lab Results  Component Value Date   FRUCTOSAMINE 391 (H) 11/17/2022   FRUCTOSAMINE 525 (H) 04/24/2021   FRUCTOSAMINE 430 (H) 04/27/2019   Lab Results  Component Value Date   CHOL 193 01/28/2023   HDL 57.70 01/28/2023   LDLCALC 99 01/28/2023   LDLDIRECT 128.0 07/21/2021   TRIG 181.0 (H) 01/28/2023   CHOLHDL 3 01/28/2023   Lab Results  Component Value Date   MICRALBCREAT 60 (H) 09/17/2022   MICRALBCREAT 2.7 03/10/2021   Lab Results  Component Value Date   CREATININE 0.59 01/28/2023   Lab Results  Component Value Date   GFR 98.54 01/28/2023    ASSESSMENT / PLAN  1. Uncontrolled type 2 diabetes mellitus with hyperglycemia, with long-term current use of insulin  (HCC)      Diabetes Mellitus type 2, complicated by diabetes retinopathy, neuropathy and microalbuminuria. - Diabetic status / severity: Uncontrolled..  Lab Results  Component Value Date   HGBA1C >15.0 08/30/2023    - Hemoglobin A1c goal <6.5%    Adjusted insulin  /diabetes regimen as follows.  - Medications:   OmniPod 5 with Dexcom G7 using insulin  Humulin  R U500  Insulin  pump setting: Basal rates ( 19.2 units/day) 12 AM: 0.8 units/h  Carb ratio 12 AM: 1:1  Sensitivity/correction factor:  12 AM: 1:60, changed to 1: 50  Target : 120  Active insulin   time 6 hours  Jardiance  25 mg daily.   Asked patient to use carb count of 2-6 range based on meal size.  Advised to bolus for meal 30 minutes before eating preferably for all the meals and a snack.  - Home glucose testing: continue CGM and check as needed.    - Discussed/ Gave Hypoglycemia treatment  plan.  # Consult : Diabetic educator.   # Annual urine for microalbuminuria/ creatinine ratio, microalbuminuria currently, continue ACE/ARB /losartan. Last  Lab Results  Component Value Date   MICRALBCREAT 60 (H) 09/17/2022    # Foot check nightly / neuropathy, continue Lyrica.  # She has diabetic retinopathy, following with ophthalmology regularly.  - Diet: Make healthy diabetic food choices.  Discussed in detail about portion control, limiting carbohydrates. - Life style / activity / exercise: Discussed.  2. Blood pressure  -  BP Readings from Last 1 Encounters:  11/14/23 (!) 150/80    - Control is in target. Mild elevated systolic blood pressure.  Advised patient to monitor at home, and discuss with primary care provider if remained high. - No change in current plans.  3. Lipid status / Hyperlipidemia - Last  Lab Results  Component Value Date   LDLCALC 99 01/28/2023   - Continue atorvastatin  20 mg daily.  Diagnoses and all orders for this visit:  Uncontrolled type 2 diabetes mellitus with hyperglycemia, with long-term current use of insulin  (HCC) -     Microalbumin / creatinine urine ratio -     Basic Metabolic Panel Without GFR -     Hemoglobin A1c -     Lipid panel      DISPOSITION Follow up in clinic in 3 months suggested.  Labs prior to follow-up visit as ordered.    All questions answered and patient verbalized understanding of the plan.  Tammy Tammy Mclamb, MD [facility] Endocrinology [facility] [address] [place], [address] [place], [place] [address] Phone # [phone]  At least part of this note was generated using voice recognition software. Inadvertent word errors may have occurred, which were not recognized during the proofreading process.

## 2023-12-20 ENCOUNTER — Telehealth: Payer: Self-pay | Admitting: Nutrition

## 2023-12-20 NOTE — Telephone Encounter (Unsigned)
 Called saying blood sugars have been in the 300s for 2 days.  No illness, or medication changes.  Says gave an injection with a syringe of 50u, but not coming down.  She changed her pod yesterday and Advised her to change her pod out because it may be a bent canula.  She agreed to do this, but wanted to scheduled an appt. For  tomorrow because patient thinks pump is not working.  Appt. Scheduled for tomorrow

## 2023-12-21 ENCOUNTER — Encounter: Attending: Endocrinology | Admitting: Nutrition

## 2023-12-21 ENCOUNTER — Telehealth: Payer: Self-pay | Admitting: Nutrition

## 2023-12-21 DIAGNOSIS — E1165 Type 2 diabetes mellitus with hyperglycemia: Secondary | ICD-10-CM | POA: Insufficient documentation

## 2023-12-21 DIAGNOSIS — Z794 Long term (current) use of insulin: Secondary | ICD-10-CM | POA: Diagnosis not present

## 2023-12-21 NOTE — Progress Notes (Signed)
 Per phone call yesterday, patient has been running very high readings for 3 days:  Hi, despite taking 3 insulin  injections of 70-80u TID of u-500 R insulin  via the pens, and using her OmniPod 5 insulin  pump.  I checked her insulin  pens and the liquid appears clear, with no particles, and patient reports that they have been kept in her refrigerator all of the time and never was frozen.   We removed her pod and the canula was not bent.  She filled a new pod and this was attached to her abdomen using a totally new site with no scars or stretch marks in the area.  He sensor reading on her PDM and Dexcom is showing high.  A fingerstick was done and this was 479, 3 hours pcB today with an additional 70u of insulin  given at 9AM.   Download of pump, shows patient is bolusing for all meals.  Denies drinking sweet drinks-only water, and not eating deserts or cereal and milk.  Bfast today was 2 eggs with 2 pieces of dried toast.  Download showed to Dr. Mercie.  Suggest we call her this afternoon to see if new insulin , new pod and new site will bring down blood sugars.  Patient notified of this and will call her back after 2 PM today.

## 2023-12-21 NOTE — Telephone Encounter (Signed)
 Patient reports that her blood sugars came down to 362 before lunch today after taking 70u extra this morning plus her bolus  She took 6u at [facility] with no extra insulin .  Blood sugar is now 379.  At 3:15 today blood sugar is now 379.

## 2023-12-21 NOTE — Telephone Encounter (Signed)
 Lets increase meal bolus for carb from 6 to 8.

## 2023-12-21 NOTE — Telephone Encounter (Signed)
 Called and told the patient to increase the meal time bolus to 8u.

## 2023-12-26 DIAGNOSIS — N951 Menopausal and female climacteric states: Secondary | ICD-10-CM | POA: Diagnosis not present

## 2023-12-26 DIAGNOSIS — E039 Hypothyroidism, unspecified: Secondary | ICD-10-CM | POA: Diagnosis not present

## 2023-12-28 ENCOUNTER — Other Ambulatory Visit: Payer: Self-pay | Admitting: Endocrinology

## 2023-12-28 DIAGNOSIS — N951 Menopausal and female climacteric states: Secondary | ICD-10-CM | POA: Diagnosis not present

## 2023-12-28 DIAGNOSIS — E1165 Type 2 diabetes mellitus with hyperglycemia: Secondary | ICD-10-CM

## 2023-12-28 NOTE — Telephone Encounter (Signed)
 Refill request complete

## 2024-02-14 DIAGNOSIS — E103513 Type 1 diabetes mellitus with proliferative diabetic retinopathy with macular edema, bilateral: Secondary | ICD-10-CM | POA: Diagnosis not present

## 2024-02-15 ENCOUNTER — Other Ambulatory Visit

## 2024-02-20 ENCOUNTER — Other Ambulatory Visit

## 2024-02-21 DIAGNOSIS — H35033 Hypertensive retinopathy, bilateral: Secondary | ICD-10-CM | POA: Diagnosis not present

## 2024-02-21 DIAGNOSIS — H31093 Other chorioretinal scars, bilateral: Secondary | ICD-10-CM | POA: Diagnosis not present

## 2024-02-21 DIAGNOSIS — H26493 Other secondary cataract, bilateral: Secondary | ICD-10-CM | POA: Diagnosis not present

## 2024-02-21 DIAGNOSIS — E113513 Type 2 diabetes mellitus with proliferative diabetic retinopathy with macular edema, bilateral: Secondary | ICD-10-CM | POA: Diagnosis not present

## 2024-02-22 ENCOUNTER — Encounter: Payer: Self-pay | Admitting: Endocrinology

## 2024-02-22 ENCOUNTER — Ambulatory Visit: Payer: Self-pay | Admitting: Endocrinology

## 2024-02-22 ENCOUNTER — Ambulatory Visit: Admitting: Endocrinology

## 2024-02-22 VITALS — BP 124/86 | HR 86 | Resp 20 | Ht 62.0 in | Wt 202.8 lb

## 2024-02-22 DIAGNOSIS — E1165 Type 2 diabetes mellitus with hyperglycemia: Secondary | ICD-10-CM | POA: Diagnosis not present

## 2024-02-22 DIAGNOSIS — Z794 Long term (current) use of insulin: Secondary | ICD-10-CM

## 2024-02-22 DIAGNOSIS — E113593 Type 2 diabetes mellitus with proliferative diabetic retinopathy without macular edema, bilateral: Secondary | ICD-10-CM | POA: Diagnosis not present

## 2024-02-22 LAB — BASIC METABOLIC PANEL WITH GFR
BUN: 18 mg/dL (ref 7–25)
CO2: 28 mmol/L (ref 20–32)
Calcium: 9.7 mg/dL (ref 8.6–10.4)
Chloride: 105 mmol/L (ref 98–110)
Creat: 0.72 mg/dL (ref 0.50–1.05)
Glucose, Bld: 112 mg/dL — ABNORMAL HIGH (ref 65–99)
Potassium: 4.5 mmol/L (ref 3.5–5.3)
Sodium: 141 mmol/L (ref 135–146)
eGFR: 96 mL/min/1.73m2 (ref >=60)

## 2024-02-22 LAB — POCT GLYCOSYLATED HEMOGLOBIN (HGB A1C): Hemoglobin A1C: 8.7 % — AB (ref 4.0–5.6)

## 2024-02-22 LAB — LIPID PANEL
Cholesterol: 196 mg/dL (ref <200)
HDL: 58 mg/dL (ref >=50)
LDL Cholesterol (Calc): 116 mg/dL — ABNORMAL HIGH
Non-HDL Cholesterol (Calc): 138 mg/dL — ABNORMAL HIGH (ref <130)
Total CHOL/HDL Ratio: 3.4 (calc) (ref <5.0)
Triglycerides: 117 mg/dL (ref <150)

## 2024-02-22 MED ORDER — EMPAGLIFLOZIN 25 MG PO TABS: 25.0000 mg | ORAL_TABLET | Freq: Every day | ORAL | 3 refills | Status: AC

## 2024-02-22 NOTE — Progress Notes (Signed)
 Outpatient Endocrinology Note [place] Stevon Gough, MD  02/22/24  Patient's Name: Tammy Chan    DOB: [birth year]    MRN: [id number]                                                    REASON OF VISIT: Follow-up of type 2 diabetes mellitus  PCP: Pa, [facility]  HISTORY OF PRESENT ILLNESS:   Sireen Halk is a 58 y.o. old female with past medical history listed below, is here for follow up of type 2 diabetes mellitus.   Pertinent Diabetes History: Patient was diagnosed with type 2 diabetes mellitus in 1996.  She was started on insulin  therapy at the time of diagnosis.  She had tried some oral medications no detail records available reports did not work effectively including metformin .  Over the years she had been on various insulin  regimen including premixed insulin , Lantus and NovoLog .  She was on Humulin  R U-500 insulin  was taking 100 units 3 times a day, prior to starting insulin  pump.  She was started on OmniPod 5 insulin  pump in January 10, 2023.  She used U-500 insulin .  She stopped using OmniPod 5 from the end of October 2024, was not covered by medical insurance.  OmniPod resumed in March 2025.  Chronic Diabetes Complications : Retinopathy: yes.  Proliferative retinopathy, following regularly with ophthalmology and retina specialist. Nephropathy: yes, on losartan. Peripheral neuropathy: yes, on Lyrica. Coronary artery disease: no Stroke: no  Relevant comorbidities and cardiovascular risk factors: Obesity: yes Body mass index is 37.09 kg/m.  Hypertension: yes Hyperlipidemia. Yes, on statin.   Current / Home Diabetic regimen includes:  OmniPod 5 with Dexcom G7 using insulin  Humulin  R U500  Insulin  pump setting: Basal rates ( 19.2 units/day) 12 AM: 0.8 units/h  Carb ratio 12 AM: 1:1  Sensitivity/correction factor:  12 AM: 1:50  Target : 120  Active insulin  time 6 hours  Jardiance  25 mg daily.   OmniPod 5 Pump & Sensor Download (Reviewed and  summarized below.) Dates: August 14 to August 27 , 2025, 14 days.    Average daily carbs entered: 12.7 g / day Average total daily insulin :  32.2 units, Basal: 58%, Food Bolus: 42%.    Previous :     Trends:  Variable blood sugar.  Some of the days acceptable blood sugar, throughout the day and sometimes for several hours.  There has been frequent significant hyperglycemia with blood sugar 300-400 range at times related to high carb meal with late meal bolus.  She tends to have hypoglycemia when on manual mode.  No concerning persistent hypoglycemia.  Overall improvement in the blood sugar compared to last visit.  Hypoglycemia: Patient has minor hypoglycemic episodes. Patient has hypoglycemia awareness.    Factors modifying glucose control: 1.  Diabetic diet assessment: Largest meal dinner late around 10 to 11 PM after work.  She has breakfast and lunch as well.  She works night shift and sleeps during the daytime.  2.  Staying active or exercising:   3.  Medication compliance: compliant most of the time.  Interval history  Insulin  pump and CGM data as reviewed above.  Improvement on blood sugar however is still with significant hyperglycemia at times.  Hemoglobin A1c improved to 8.7%.  She reports she recently has bleeding in the eye with worsening diabetic  retinopathy, going to see retina specialist for treatment soon.  No other complaints today.  REVIEW OF SYSTEMS As per history of present illness.   PAST MEDICAL HISTORY: Past Medical History:  Diagnosis Date   Diabetes mellitus without complication (HCC)    Hyperlipidemia    Hypertension    Neuromuscular disorder (HCC)    Neuropathy     PAST SURGICAL HISTORY: Past Surgical History:  Procedure Laterality Date   TUBAL LIGATION  2003    ALLERGIES: Allergies  Allergen Reactions   Ozempic [Semaglutide] Diarrhea   Erythromycin Nausea And Vomiting   Lisinopril Itching, Other (See Comments) and Cough    Reaction:   Throat itching     FAMILY HISTORY:  Family History  Problem Relation Age of Onset   Hypertension Mother    Heart disease Mother    Hypertension Father    Hypertension Sister    Hypertension Sister    Diabetes Maternal Grandfather    Diabetes Paternal Grandfather     SOCIAL HISTORY: Social History   Socioeconomic History   Marital status: Single    Spouse name: Not on file   Number of children: 3   Years of education: Not on file   Highest education level: Not on file  Occupational History   Occupation: LPN     Comment: [place]  Tobacco Use   Smoking status: Never   Smokeless tobacco: Never  Vaping Use   Vaping status: Never Used  Substance and Sexual Activity   Alcohol use: No    Comment: socical   Drug use: No   Sexual activity: Not Currently    Birth control/protection: None  Other Topics Concern   Not on file  Social History Narrative   Not on file   Social Drivers of Health   Financial Resource Strain: Not on file  Food Insecurity: Not on file  Transportation Needs: Not on file  Physical Activity: Not on file  Stress: No Stress Concern Present (04/13/2021)   Received from [facility] - Occupational Stress Questionnaire    Feeling of Stress : Not at all  Social Connections: Unknown (11/10/2021)   [facility] from [facility]   Social Network    Social Network: Not on file    MEDICATIONS:  Current Outpatient Medications  Medication Sig Dispense Refill   atorvastatin  (LIPITOR) 20 MG tablet Take 1 tablet (20 mg total) by mouth daily. 90 tablet 3   Cetirizine  HCl 10 MG CAPS Take by mouth.     Continuous Glucose Sensor (DEXCOM G7 SENSOR) MISC USE AS DIRECTED 3 each 2   Continuous Glucose Sensor (FREESTYLE LIBRE 3 PLUS SENSOR) MISC 1 each by Does not apply route continuous. Change every 15 days. 6 each 3   fluticasone  (FLONASE ) 50 MCG/ACT nasal spray Place 1-2 sprays into both nostrils daily. 16 g 0    glucose blood (CONTOUR NEXT TEST) test strip 1 each by Other route in the morning, at noon, in the evening, and at bedtime. 300 each 3   HUMULIN  R 500 UNIT/ML injection USE UP TO 0.6 ML DAILY IN INSULIN  PUMP 20 mL 0   Insulin  Disposable Pump (OMNIPOD 5 DEXG7G6 PODS GEN 5) MISC 1 each by Does not apply route every 3 (three) days. 30 each 3   Insulin  Disposable Pump (OMNIPOD 5 G6 PODS, GEN 5,) MISC 1 Device by Does not apply route every 3 (three) days. 2 each 3   Insulin  Pen Needle (B-D UF  III MINI PEN NEEDLES) 31G X 5 MM MISC USE WITH INSULIN  PEN 3 TIMES A DAY 100 each 4   insulin  regular human CONCENTRATED (HUMULIN  R U-500 KWIKPEN) 500 UNIT/ML KwikPen Inject 80 Units into the skin 3 (three) times daily with meals. 30 mL 3   Lancets Misc. MISC 1 each by Does not apply route in the morning, at noon, in the evening, and at bedtime. May substitute to any manufacturer covered by patient's insurance. 300 each 3   losartan (COZAAR) 100 MG tablet Take by mouth.     metoCLOPramide  (REGLAN ) 5 MG tablet Take 1 tablet (5 mg total) by mouth 4 (four) times daily -  before meals and at bedtime. 90 tablet 1   pregabalin (LYRICA) 25 MG capsule Take by mouth.     sucralfate  (CARAFATE ) 1 GM/10ML suspension Take 10 mLs (1 g total) by mouth 4 (four) times daily -  with meals and at bedtime. 420 mL 0   empagliflozin  (JARDIANCE ) 25 MG TABS tablet Take 1 tablet (25 mg total) by mouth daily before breakfast. 90 tablet 3   No current facility-administered medications for this visit.    PHYSICAL EXAM: Vitals:   02/22/24 1112  BP: 124/86  Pulse: 86  Resp: 20  SpO2: 96%  Weight: 202 lb 12.8 oz (92 kg)  Height: 5' 2 (1.575 m)      Body mass index is 37.09 kg/m.  Wt Readings from Last 3 Encounters:  02/22/24 202 lb 12.8 oz (92 kg)  11/14/23 206 lb 3.2 oz (93.5 kg)  08/30/23 181 lb (82.1 kg)    General: Well developed, well nourished female in no apparent distress.  HEENT: AT/[place], no external lesions.   Eyes: Conjunctiva clear and no icterus. Neck: Neck supple  Lungs: Respirations not labored Neurologic: Alert, oriented, normal speech Extremities / Skin: Dry.  Psychiatric: Does not appear depressed or anxious  Diabetic Foot Exam - Simple   No data filed    LABS Reviewed Lab Results  Component Value Date   HGBA1C 8.7 (A) 02/22/2024   HGBA1C >15.0 08/30/2023   HGBA1C 9.1 (A) 05/16/2023   Lab Results  Component Value Date   FRUCTOSAMINE 391 (H) 11/17/2022   FRUCTOSAMINE 525 (H) 04/24/2021   FRUCTOSAMINE 430 (H) 04/27/2019   Lab Results  Component Value Date   CHOL 193 01/28/2023   HDL 57.70 01/28/2023   LDLCALC 99 01/28/2023   LDLDIRECT 128.0 07/21/2021   TRIG 181.0 (H) 01/28/2023   CHOLHDL 3 01/28/2023   Lab Results  Component Value Date   MICRALBCREAT 60 (H) 09/17/2022   Lab Results  Component Value Date   CREATININE 0.59 01/28/2023   Lab Results  Component Value Date   GFR 98.54 01/28/2023    ASSESSMENT / PLAN  1. Uncontrolled type 2 diabetes mellitus with hyperglycemia, with long-term current use of insulin  (HCC)   2. Proliferative diabetic retinopathy of both eyes associated with type 2 diabetes mellitus, unspecified proliferative retinopathy type (HCC)     Diabetes Mellitus type 2, complicated by diabetes retinopathy, neuropathy and microalbuminuria. - Diabetic status / severity: Uncontrolled.  Improving  Lab Results  Component Value Date   HGBA1C 8.7 (A) 02/22/2024    - Hemoglobin A1c goal <6.5%    Adjusted insulin  /diabetes regimen as follows.  - Medications:   OmniPod 5 with Dexcom G7 using insulin  Humulin  R U500  Insulin  pump setting: Basal rates ( 19.2 units/day) 12 AM: 0.8 units/h  Carb ratio 12 AM: 1:1  Sensitivity/correction factor:  12 AM: 1:50, consider changing to 1: 45 due to hyperglycemia.  Patient does not want to change today.  Target : 120  Active insulin  time 6 hours  Jardiance  25 mg daily.   Asked patient to  use carb count of 2-6 range based on meal size.  Advised to bolus for meal 30 minutes before eating preferably for all the meals and a snack.  - Home glucose testing: continue CGM and check as needed.    - Discussed/ Gave Hypoglycemia treatment plan.  # Consult : Diabetic educator.   # Annual urine for microalbuminuria/ creatinine ratio, + microalbuminuria currently, continue ACE/ARB /losartan/ SGLT2 inhibitor. Last  Lab Results  Component Value Date   MICRALBCREAT 60 (H) 09/17/2022    # Foot check nightly / neuropathy, continue Lyrica.  # She has diabetic retinopathy, following with ophthalmology [facility] specialist regularly.  - Diet: Make healthy diabetic food choices.  Discussed in detail about portion control, limiting carbohydrates. - Life style / activity / exercise: Discussed.  2. Blood pressure  -  BP Readings from Last 1 Encounters:  02/22/24 124/86    - Control is in target.  - No change in current plans.  3. Lipid status / Hyperlipidemia - Last  Lab Results  Component Value Date   LDLCALC 99 01/28/2023   - Continue atorvastatin  20 mg daily.  Diagnoses and all orders for this visit:  Uncontrolled type 2 diabetes mellitus with hyperglycemia, with long-term current use of insulin  (HCC) -     POCT glycosylated hemoglobin (Hb A1C) -     Lipid panel -     Basic metabolic panel with GFR -     Microalbumin / creatinine urine ratio -     empagliflozin  (JARDIANCE ) 25 MG TABS tablet; Take 1 tablet (25 mg total) by mouth daily before breakfast.  Proliferative diabetic retinopathy of both eyes associated with type 2 diabetes mellitus, unspecified proliferative retinopathy type (HCC)    DISPOSITION Follow up in clinic in 3 months suggested.     All questions answered and patient verbalized understanding of the plan.  [place] Nazly Digilio, MD [facility] Endocrinology [facility] [address] [place], [address] [place], [place] [address] Phone # [phone]  At  least part of this note was generated using voice recognition software. Inadvertent word errors may have occurred, which were not recognized during the proofreading process.

## 2024-02-23 ENCOUNTER — Encounter: Payer: Self-pay | Admitting: Endocrinology

## 2024-02-24 ENCOUNTER — Other Ambulatory Visit: Payer: Self-pay | Admitting: Endocrinology

## 2024-02-24 DIAGNOSIS — E782 Mixed hyperlipidemia: Secondary | ICD-10-CM

## 2024-02-24 MED ORDER — ATORVASTATIN CALCIUM 40 MG PO TABS: 40.0000 mg | ORAL_TABLET | Freq: Every day | ORAL | 3 refills | Status: AC

## 2024-03-01 DIAGNOSIS — H40053 Ocular hypertension, bilateral: Secondary | ICD-10-CM | POA: Diagnosis not present

## 2024-03-01 DIAGNOSIS — H26491 Other secondary cataract, right eye: Secondary | ICD-10-CM | POA: Diagnosis not present

## 2024-03-01 DIAGNOSIS — E103513 Type 1 diabetes mellitus with proliferative diabetic retinopathy with macular edema, bilateral: Secondary | ICD-10-CM | POA: Diagnosis not present

## 2024-03-01 LAB — HM DIABETES EYE EXAM

## 2024-03-05 ENCOUNTER — Ambulatory Visit: Payer: Self-pay | Admitting: Endocrinology

## 2024-03-06 DIAGNOSIS — E113512 Type 2 diabetes mellitus with proliferative diabetic retinopathy with macular edema, left eye: Secondary | ICD-10-CM | POA: Diagnosis not present

## 2024-03-20 DIAGNOSIS — E103513 Type 1 diabetes mellitus with proliferative diabetic retinopathy with macular edema, bilateral: Secondary | ICD-10-CM | POA: Diagnosis not present

## 2024-03-20 DIAGNOSIS — H401124 Primary open-angle glaucoma, left eye, indeterminate stage: Secondary | ICD-10-CM | POA: Diagnosis not present

## 2024-03-20 DIAGNOSIS — H527 Unspecified disorder of refraction: Secondary | ICD-10-CM | POA: Diagnosis not present

## 2024-04-03 DIAGNOSIS — E113513 Type 2 diabetes mellitus with proliferative diabetic retinopathy with macular edema, bilateral: Secondary | ICD-10-CM | POA: Diagnosis not present

## 2024-04-03 DIAGNOSIS — H35033 Hypertensive retinopathy, bilateral: Secondary | ICD-10-CM | POA: Diagnosis not present

## 2024-04-03 DIAGNOSIS — H31093 Other chorioretinal scars, bilateral: Secondary | ICD-10-CM | POA: Diagnosis not present

## 2024-04-03 LAB — OPHTHALMOLOGY REPORT-SCANNED

## 2024-04-06 ENCOUNTER — Ambulatory Visit: Payer: Self-pay | Admitting: Endocrinology

## 2024-05-29 DIAGNOSIS — E7800 Pure hypercholesterolemia, unspecified: Secondary | ICD-10-CM | POA: Diagnosis not present

## 2024-05-29 DIAGNOSIS — D72829 Elevated white blood cell count, unspecified: Secondary | ICD-10-CM | POA: Diagnosis not present

## 2024-05-29 DIAGNOSIS — E1165 Type 2 diabetes mellitus with hyperglycemia: Secondary | ICD-10-CM | POA: Diagnosis not present

## 2024-05-29 DIAGNOSIS — I10 Essential (primary) hypertension: Secondary | ICD-10-CM | POA: Diagnosis not present

## 2024-05-29 DIAGNOSIS — Z9842 Cataract extraction status, left eye: Secondary | ICD-10-CM | POA: Diagnosis not present

## 2024-05-29 DIAGNOSIS — Z79899 Other long term (current) drug therapy: Secondary | ICD-10-CM | POA: Diagnosis not present

## 2024-05-29 DIAGNOSIS — Z794 Long term (current) use of insulin: Secondary | ICD-10-CM | POA: Diagnosis not present

## 2024-05-29 DIAGNOSIS — I493 Ventricular premature depolarization: Secondary | ICD-10-CM | POA: Diagnosis not present

## 2024-05-29 DIAGNOSIS — Z6838 Body mass index (BMI) 38.0-38.9, adult: Secondary | ICD-10-CM | POA: Diagnosis not present

## 2024-05-29 DIAGNOSIS — N3000 Acute cystitis without hematuria: Secondary | ICD-10-CM | POA: Diagnosis not present

## 2024-05-29 DIAGNOSIS — R410 Disorientation, unspecified: Secondary | ICD-10-CM | POA: Diagnosis not present

## 2024-05-29 DIAGNOSIS — E162 Hypoglycemia, unspecified: Secondary | ICD-10-CM | POA: Diagnosis not present

## 2024-05-29 DIAGNOSIS — E875 Hyperkalemia: Secondary | ICD-10-CM | POA: Diagnosis not present

## 2024-05-29 DIAGNOSIS — Z66 Do not resuscitate: Secondary | ICD-10-CM | POA: Diagnosis not present

## 2024-05-29 DIAGNOSIS — Z881 Allergy status to other antibiotic agents status: Secondary | ICD-10-CM | POA: Diagnosis not present

## 2024-05-29 DIAGNOSIS — Z888 Allergy status to other drugs, medicaments and biological substances status: Secondary | ICD-10-CM | POA: Diagnosis not present

## 2024-05-29 DIAGNOSIS — E860 Dehydration: Secondary | ICD-10-CM | POA: Diagnosis not present

## 2024-05-29 DIAGNOSIS — Z7984 Long term (current) use of oral hypoglycemic drugs: Secondary | ICD-10-CM | POA: Diagnosis not present

## 2024-05-29 DIAGNOSIS — N3001 Acute cystitis with hematuria: Secondary | ICD-10-CM | POA: Diagnosis not present

## 2024-05-29 DIAGNOSIS — E11649 Type 2 diabetes mellitus with hypoglycemia without coma: Secondary | ICD-10-CM | POA: Diagnosis not present

## 2024-05-29 DIAGNOSIS — Z9841 Cataract extraction status, right eye: Secondary | ICD-10-CM | POA: Diagnosis not present

## 2024-05-29 DIAGNOSIS — R531 Weakness: Secondary | ICD-10-CM | POA: Diagnosis not present

## 2024-05-29 DIAGNOSIS — K589 Irritable bowel syndrome without diarrhea: Secondary | ICD-10-CM | POA: Diagnosis not present

## 2024-05-29 DIAGNOSIS — R519 Headache, unspecified: Secondary | ICD-10-CM | POA: Diagnosis not present

## 2024-05-29 DIAGNOSIS — Z9851 Tubal ligation status: Secondary | ICD-10-CM | POA: Diagnosis not present

## 2024-05-30 ENCOUNTER — Telehealth: Payer: Self-pay | Admitting: Nutrition

## 2024-05-30 ENCOUNTER — Other Ambulatory Visit: Payer: Self-pay | Admitting: Endocrinology

## 2024-05-30 DIAGNOSIS — E1165 Type 2 diabetes mellitus with hyperglycemia: Secondary | ICD-10-CM

## 2024-05-30 NOTE — Telephone Encounter (Signed)
 Patient reported that she had a low blood sugar and passed out yesterday.  She is currently in the hospital and was wanting to know when she is seeing Dr. Mercie.  She is not sure when she will be discharged and was told to call if she is discharged today and we will try to get her in sooner than next week.  She could not talk now about what had happened because a doctor came into the room and was told to call me back.

## 2024-06-01 ENCOUNTER — Encounter: Admitting: Dietician

## 2024-06-01 ENCOUNTER — Telehealth: Payer: Self-pay | Admitting: Dietician

## 2024-06-01 DIAGNOSIS — Z794 Long term (current) use of insulin: Secondary | ICD-10-CM | POA: Insufficient documentation

## 2024-06-01 DIAGNOSIS — E1165 Type 2 diabetes mellitus with hyperglycemia: Secondary | ICD-10-CM | POA: Insufficient documentation

## 2024-06-01 NOTE — Telephone Encounter (Signed)
 Dr. Gennaro called from [facility].  She was admitted due to hypoglycemia. Dr. Gennaro is asking for her to be worked in today to make pump setting adjustments. I can see her as a walk in 2 or before.  Will get orders from MD.  Omnipod 5 pump  Leita Constable, RD, LDN, CDCES, DipACLM

## 2024-06-01 NOTE — Progress Notes (Signed)
 Patient is a walk in appointment.  Dr. Francoise at [facility] requested to adjust pump settings. This was a brief appointment 1430-1440 in person and another 20 minutes spent in chart and document review.  05/29/2024:  Patient states that she got off work that morning, ate 2 slices of cheese toast, bolused 3 units and went to bed (works nights).  She states that she woke up rolled in the comforter on the floor.  She states that EMS reported that her blood glucose was 38.  She was admitted to the hospital and discharged today.  Dexcom Clarity Report and the glooko report were reviewed and there was not CGM or pump data from 05/27/2024 to 05/30/2024 and it does not appear that patient was wearing the CGM or pump at that time. [facility] Encounter 05/29/2024 note from Dr. Francoise was reviewed.  This stated that patient used 60 units of insulin  around 8 am the morning of the incident and patient's blood glucose was 35 when EMS arrived.  MD note also states that patient frequently skips meals.  Discussed glooko report with Dr. Mercie and the following verbal orders were received and patient instructed.  Basal rate was changed from 0.8 >> 0.7. Patient instructed to bolus 2-4 units before meals depending on the amount of carbohydrates in that meal. Instructed patient to run the pump in automatic mode most of the time.   Patient works nights as a engineer, civil (consulting) and states that the building is old and the pump is frequently in manual mode at work.  Instructed patient to keep her follow up appointment with her endocrinologist on 06/05/2024.  She has a follow up with the MD on 06/05/2024.

## 2024-06-01 NOTE — Patient Instructions (Addendum)
 Bolus 2-4 units before you eat.  The amount you bolus should depend on the amount of carbohydrate in the meal.  Run the pump in Automatic mode as much as possible  Follow up with Dr. Mercie on 06/05/2024.

## 2024-06-05 ENCOUNTER — Telehealth: Admitting: Endocrinology

## 2024-06-05 ENCOUNTER — Encounter: Payer: Self-pay | Admitting: Endocrinology

## 2024-06-05 DIAGNOSIS — Z794 Long term (current) use of insulin: Secondary | ICD-10-CM

## 2024-06-05 DIAGNOSIS — E1165 Type 2 diabetes mellitus with hyperglycemia: Secondary | ICD-10-CM | POA: Diagnosis not present

## 2024-06-05 MED ORDER — HUMULIN R U-500 KWIKPEN 500 UNIT/ML ~~LOC~~ SOPN: PEN_INJECTOR | SUBCUTANEOUS | 4 refills | Status: AC

## 2024-06-05 NOTE — Progress Notes (Signed)
 Outpatient Endocrinology Note Milley Vining, MD  06/05/24  Patient's Name: Tammy Chan    DOB: 07-23-1965    MRN: [id number]  I connected with  Ceasar Blush on 06/05/24 by a video enabled telemedicine application and verified that I am speaking with the correct person using two identifiers.   I discussed the limitations of evaluation and management by telemedicine. The patient expressed understanding and agreed to proceed.   Provider location : Office  Patient location : At home  MyChart virtual visit due to weather.                                                   REASON OF VISIT: Follow-up of type 2 diabetes mellitus  PCP: [facility], [facility]  HISTORY OF PRESENT ILLNESS:   Tammy Chan is a 58 y.o. old female with past medical history listed below, is here for follow up of type 2 diabetes mellitus.   Pertinent Diabetes History: Patient was diagnosed with type 2 diabetes mellitus in 1996.  She was started on insulin  therapy at the time of diagnosis.  She had tried some oral medications no detail records available reports did not work effectively including metformin .  Over the years she had been on various insulin  regimen including premixed insulin , Lantus and NovoLog .  She was on Humulin  R U-500 insulin  was taking 100 units 3 times a day, prior to starting insulin  pump.  She was started on OmniPod 5 insulin  pump in January 10, 2023.  She used U-500 insulin .  She stopped using OmniPod 5 from the end of October 2024, was not covered by medical insurance.  OmniPod resumed in March 2025.  Chronic Diabetes Complications : Retinopathy: yes.  Proliferative retinopathy, following regularly with ophthalmology and retina specialist. Nephropathy: yes, on losartan. Peripheral neuropathy: yes, on Lyrica. Coronary artery disease: no Stroke: no  Relevant comorbidities and cardiovascular risk factors: Obesity: yes There is no height or weight on file to calculate BMI.   Hypertension: yes Hyperlipidemia. Yes, on statin.   Current / Home Diabetic regimen includes:  OmniPod 5 with Dexcom G7 using insulin  Humulin  R U500  Insulin  pump setting: Basal rates (  units/day) 12 AM: 0.7 units/h  Carb ratio 12 AM: 1:1  Sensitivity/correction factor:  12 AM: 1:50  Target : 120  Active insulin  time 6 hours  Jardiance  25 mg daily. Not taking.   OmniPod 5 Pump & Sensor Download (Reviewed and summarized below.) Dates: November 25 - June 04, 2024, 14 days.    Average daily carbs entered: 8.6 g / day Average total daily insulin :  24 units, Basal: 64%, Food Bolus: 36%.  Automated mode 48%.     Trends:  Variable blood sugar with significant hyperglycemia usually 250 range and sometimes up to 400 range postprandially related to not enough meal bolus and missing meal bolus.  Exiting out of auto mode frequently due to hyperglycemia.  When on manual mode tends to have occasionally hypoglycemia in between the meals.  Insulin  pump setting was adjusted on December 5 with diabetic educator visit no longer having hypoglycemia on manual mode.  Hypoglycemia: Patient has minor hypoglycemic episodes. Patient has hypoglycemia awareness.    Factors modifying glucose control: 1.  Diabetic diet assessment: Largest meal dinner late around 10 to 11 PM after work.  She has breakfast and lunch as  well.  She works night shift and sleeps during the daytime.  2.  Staying active or exercising:   3.  Medication compliance: compliant most of the time.  Interval history  Patient had a ER visit on December 2, due to a hypoglycemia, blood sugar in 30s.  Patient reports she was not on insulin  pump at that time took U-500 insulin  60 units in the morning and slept all day and wake up around midnight.  Did not eat meals.  She was also diagnosed with UTI and discharged on antibiotics.  Pump and CGM data as reviewed above.  Patient had visit with diabetic educator on December 5,  adjusted pump setting.  She still has postprandial hyperglycemia.  Hemoglobin A1c on December 2 was 9.4%.  Jardiance  was kept on hold after ER visit on December 2 due to UTI.  She has no other complaints today.  Today's MyChart virtual video visit.  REVIEW OF SYSTEMS As per history of present illness.   PAST MEDICAL HISTORY: Past Medical History:  Diagnosis Date   Diabetes mellitus without complication (HCC)    Hyperlipidemia    Hypertension    Neuromuscular disorder (HCC)    Neuropathy     PAST SURGICAL HISTORY: Past Surgical History:  Procedure Laterality Date   TUBAL LIGATION  2003    ALLERGIES: Allergies  Allergen Reactions   Ozempic [Semaglutide] Diarrhea   Erythromycin Nausea And Vomiting   Lisinopril Itching, Other (See Comments) and Cough    Reaction:  Throat itching     FAMILY HISTORY:  Family History  Problem Relation Age of Onset   Hypertension Mother    Heart disease Mother    Hypertension Father    Hypertension Sister    Hypertension Sister    Diabetes Maternal Grandfather    Diabetes Paternal Grandfather     SOCIAL HISTORY: Social History   Socioeconomic History   Marital status: Single    Spouse name: Not on file   Number of children: 3   Years of education: Not on file   Highest education level: Not on file  Occupational History   Occupation: LPN     Comment: [place]  Tobacco Use   Smoking status: Never   Smokeless tobacco: Never  Vaping Use   Vaping status: Never Used  Substance and Sexual Activity   Alcohol use: No    Comment: socical   Drug use: No   Sexual activity: Not Currently    Birth control/protection: None  Other Topics Concern   Not on file  Social History Narrative   Not on file   Social Drivers of Health   Financial Resource Strain: Not on file  Food Insecurity: Not on file  Transportation Needs: Not on file  Physical Activity: Not on file  Stress: No Stress Concern Present (04/13/2021)   Received  from [facility] - Occupational Stress Questionnaire    Feeling of Stress : Not at all  Social Connections: Not on file    MEDICATIONS:  Current Outpatient Medications  Medication Sig Dispense Refill   atorvastatin  (LIPITOR) 40 MG tablet Take 1 tablet (40 mg total) by mouth daily. 90 tablet 3   Cetirizine  HCl 10 MG CAPS Take by mouth.     Continuous Glucose Sensor (DEXCOM G7 SENSOR) MISC USE AS DIRECTED 3 each 2   fluticasone  (FLONASE ) 50 MCG/ACT nasal spray Place 1-2 sprays into both nostrils daily. 16 g 0   glucose blood (CONTOUR NEXT  TEST) test strip 1 each by Other route in the morning, at noon, in the evening, and at bedtime. 300 each 3   Insulin  Disposable Pump (OMNIPOD 5 DEXG7G6 PODS GEN 5) MISC 1 each by Does not apply route every 3 (three) days. 30 each 3   Insulin  Disposable Pump (OMNIPOD 5 G6 PODS, GEN 5,) MISC 1 Device by Does not apply route every 3 (three) days. 2 each 3   Insulin  Pen Needle (B-D UF III MINI PEN NEEDLES) 31G X 5 MM MISC USE WITH INSULIN  PEN 3 TIMES A DAY 100 each 4   Lancets Misc. MISC 1 each by Does not apply route in the morning, at noon, in the evening, and at bedtime. May substitute to any manufacturer covered by patient's insurance. 300 each 3   losartan (COZAAR) 100 MG tablet Take by mouth.     metoCLOPramide  (REGLAN ) 5 MG tablet Take 1 tablet (5 mg total) by mouth 4 (four) times daily -  before meals and at bedtime. 90 tablet 1   pregabalin (LYRICA) 25 MG capsule Take by mouth.     sucralfate  (CARAFATE ) 1 GM/10ML suspension Take 10 mLs (1 g total) by mouth 4 (four) times daily -  with meals and at bedtime. 420 mL 0   empagliflozin  (JARDIANCE ) 25 MG TABS tablet Take 1 tablet (25 mg total) by mouth daily before breakfast. (Patient not taking: Reported on 06/05/2024) 90 tablet 3   insulin  regular human CONCENTRATED (HUMULIN  R U-500 KWIKPEN) 500 UNIT/ML KwikPen Use up to 150 units /day via insulin  pump. 48 mL 4    No current facility-administered medications for this visit.    PHYSICAL EXAM: There were no vitals filed for this visit.     There is no height or weight on file to calculate BMI.  Wt Readings from Last 3 Encounters:  02/22/24 202 lb 12.8 oz (92 kg)  11/14/23 206 lb 3.2 oz (93.5 kg)  08/30/23 181 lb (82.1 kg)    General: Well developed, well nourished female in no apparent distress.  HEENT: AT/[place], no external lesions.  Eyes: Conjunctiva clear and no icterus. Neurologic: Alert, oriented, normal speech Psychiatric: Does not appear depressed or anxious  Diabetic Foot Exam - Simple   No data filed    LABS Reviewed Lab Results  Component Value Date   HGBA1C 8.7 (A) 02/22/2024   HGBA1C >15.0 08/30/2023   HGBA1C 9.1 (A) 05/16/2023   Lab Results  Component Value Date   FRUCTOSAMINE 391 (H) 11/17/2022   FRUCTOSAMINE 525 (H) 04/24/2021   FRUCTOSAMINE 430 (H) 04/27/2019   Lab Results  Component Value Date   CHOL 196 02/22/2024   HDL 58 02/22/2024   LDLCALC 116 (H) 02/22/2024   LDLDIRECT 128.0 07/21/2021   TRIG 117 02/22/2024   CHOLHDL 3.4 02/22/2024   Lab Results  Component Value Date   MICRALBCREAT 60 (H) 09/17/2022   Lab Results  Component Value Date   CREATININE 0.72 02/22/2024   Lab Results  Component Value Date   GFR 98.54 01/28/2023    ASSESSMENT / PLAN  1. Uncontrolled type 2 diabetes mellitus with hyperglycemia, with long-term current use of insulin  (HCC)     Diabetes Mellitus type 2, complicated by diabetes retinopathy, neuropathy and microalbuminuria. - Diabetic status / severity: Uncontrolled.    Lab Results  Component Value Date   HGBA1C 8.7 (A) 02/22/2024    - Hemoglobin A1c goal <6.5%   Recent hemoglobin A1c was 9.4%, worsening diabetes control.  She has frequent  and significant hyperglycemia postprandially.  She had recent ER visit due to hypoglycemia, due to relatively high dose of insulin  by injection, and not eating.  She was  not using insulin  pump at that time.  Discussed that always use insulin  pump as much as possible and using automated mode.  Discussed that significant hyperglycemia will exit out of auto mode to manual mode and she has to manually restart automated mode.   Adjusted insulin  /diabetes regimen as follows.  - Medications:   OmniPod 5 with Dexcom G7 using insulin  Humulin  R U500  Insulin  pump setting: Basal rates ( units/day) 12 AM: 0.7 units/h recently decreased basal rate on December 5.  Carb ratio 12 AM: 1:1, advised to use carb count of 2-6.  She has been currently using 2-4.  Sensitivity/correction factor:  12 AM: 1:50  Target : 120  Active insulin  time 6 hours  Jardiance  25 mg daily.  Currently on hold due to UTI.  Will resume in the future after resolution of UTI.  Asked patient to use carb count of 2-6 range based on meal size.  Advised to bolus for meal 30 minutes before eating preferably for all the meals and a snack.  - Home glucose testing: continue CGM and check as needed.    - Discussed/ Gave Hypoglycemia treatment plan.  # Consult : Diabetic educator.   # Annual urine for microalbuminuria/ creatinine ratio, + microalbuminuria currently, continue ACE/ARB /losartan/ SGLT2 inhibitor (currently on hold). Last  Lab Results  Component Value Date   MICRALBCREAT 60 (H) 09/17/2022    # Foot check nightly / neuropathy, continue Lyrica.  # She has diabetic retinopathy, following with ophthalmology [place] specialist regularly.  - Diet: Make healthy diabetic food choices.  Discussed in detail about portion control, limiting carbohydrates. - Life style / activity / exercise: Discussed.  2. Blood pressure  -  BP Readings from Last 1 Encounters:  02/22/24 124/86    - Control is in target.  - No change in current plans.  3. Lipid status / Hyperlipidemia - Last  Lab Results  Component Value Date   LDLCALC 116 (H) 02/22/2024   - Continue atorvastatin  20 mg  daily.  Diagnoses and all orders for this visit:  Uncontrolled type 2 diabetes mellitus with hyperglycemia, with long-term current use of insulin  (HCC) -     insulin  regular human CONCENTRATED (HUMULIN  R U-500 KWIKPEN) 500 UNIT/ML KwikPen; Use up to 150 units /day via insulin  pump.   DISPOSITION Follow up in clinic in 3 months suggested.     All questions answered and patient verbalized understanding of the plan.  Genisis Sonnier, MD [facility] Endocrinology [facility] [address] [place], [address] [place], [place] [address] Phone # [phone]  At least part of this note was generated using voice recognition software. Inadvertent word errors may have occurred, which were not recognized during the proofreading process.
